# Patient Record
Sex: Female | Born: 1985 | Race: Black or African American | Hispanic: No | Marital: Married | Smoking: Former smoker
Health system: Southern US, Community
[De-identification: ages and names within clinical notes are randomized; demographics above are authoritative.]

## PROBLEM LIST (undated history)

## (undated) DIAGNOSIS — Z789 Other specified health status: Secondary | ICD-10-CM

## (undated) DIAGNOSIS — F331 Major depressive disorder, recurrent, moderate: Secondary | ICD-10-CM

## (undated) DIAGNOSIS — F32A Depression, unspecified: Secondary | ICD-10-CM

## (undated) DIAGNOSIS — J302 Other seasonal allergic rhinitis: Secondary | ICD-10-CM

## (undated) DIAGNOSIS — O24419 Gestational diabetes mellitus in pregnancy, unspecified control: Secondary | ICD-10-CM

## (undated) DIAGNOSIS — F329 Major depressive disorder, single episode, unspecified: Secondary | ICD-10-CM

## (undated) DIAGNOSIS — Z720 Tobacco use: Secondary | ICD-10-CM

## (undated) DIAGNOSIS — R87629 Unspecified abnormal cytological findings in specimens from vagina: Secondary | ICD-10-CM

## (undated) DIAGNOSIS — I10 Essential (primary) hypertension: Secondary | ICD-10-CM

## (undated) DIAGNOSIS — T7840XA Allergy, unspecified, initial encounter: Secondary | ICD-10-CM

## (undated) HISTORY — DX: Unspecified abnormal cytological findings in specimens from vagina: R87.629

## (undated) HISTORY — DX: Essential (primary) hypertension: I10

## (undated) HISTORY — DX: Major depressive disorder, recurrent, moderate: F33.1

## (undated) HISTORY — DX: Tobacco use: Z72.0

## (undated) HISTORY — DX: Gestational diabetes mellitus in pregnancy, unspecified control: O24.419

## (undated) HISTORY — DX: Major depressive disorder, single episode, unspecified: F32.9

## (undated) HISTORY — DX: Allergy, unspecified, initial encounter: T78.40XA

## (undated) HISTORY — DX: Other seasonal allergic rhinitis: J30.2

## (undated) HISTORY — DX: Depression, unspecified: F32.A

## (undated) HISTORY — PX: OTHER SURGICAL HISTORY: SHX169

---
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
## 2006-02-03 ENCOUNTER — Ambulatory Visit: Payer: Self-pay | Admitting: Family Medicine

## 2006-02-21 ENCOUNTER — Ambulatory Visit: Payer: Self-pay | Admitting: Family Medicine

## 2006-02-21 ENCOUNTER — Encounter (INDEPENDENT_AMBULATORY_CARE_PROVIDER_SITE_OTHER): Payer: Self-pay | Admitting: *Deleted

## 2006-05-04 ENCOUNTER — Ambulatory Visit: Payer: Self-pay | Admitting: Family Medicine

## 2006-05-10 ENCOUNTER — Encounter (INDEPENDENT_AMBULATORY_CARE_PROVIDER_SITE_OTHER): Payer: Self-pay | Admitting: Specialist

## 2006-05-10 ENCOUNTER — Other Ambulatory Visit: Admission: RE | Admit: 2006-05-10 | Discharge: 2006-05-10 | Payer: Self-pay | Admitting: Obstetrics & Gynecology

## 2006-05-10 ENCOUNTER — Ambulatory Visit: Payer: Self-pay | Admitting: Obstetrics & Gynecology

## 2006-05-24 ENCOUNTER — Ambulatory Visit: Payer: Self-pay | Admitting: Obstetrics & Gynecology

## 2006-06-01 DIAGNOSIS — G43909 Migraine, unspecified, not intractable, without status migrainosus: Secondary | ICD-10-CM

## 2006-06-01 HISTORY — DX: Migraine, unspecified, not intractable, without status migrainosus: G43.909

## 2006-06-15 ENCOUNTER — Ambulatory Visit: Payer: Self-pay | Admitting: Family Medicine

## 2006-07-14 ENCOUNTER — Ambulatory Visit: Payer: Self-pay | Admitting: Family Medicine

## 2006-07-14 DIAGNOSIS — N912 Amenorrhea, unspecified: Secondary | ICD-10-CM | POA: Insufficient documentation

## 2006-07-21 ENCOUNTER — Ambulatory Visit: Payer: Self-pay | Admitting: Family Medicine

## 2006-07-21 LAB — CONVERTED CEMR LAB: Beta hcg, urine, semiquantitative: NEGATIVE

## 2006-08-04 ENCOUNTER — Telehealth: Payer: Self-pay | Admitting: *Deleted

## 2006-08-07 ENCOUNTER — Ambulatory Visit: Payer: Self-pay | Admitting: Sports Medicine

## 2006-08-07 DIAGNOSIS — J301 Allergic rhinitis due to pollen: Secondary | ICD-10-CM

## 2006-08-08 ENCOUNTER — Telehealth (INDEPENDENT_AMBULATORY_CARE_PROVIDER_SITE_OTHER): Payer: Self-pay | Admitting: Family Medicine

## 2006-08-10 ENCOUNTER — Telehealth: Payer: Self-pay | Admitting: *Deleted

## 2006-09-14 ENCOUNTER — Telehealth: Payer: Self-pay | Admitting: *Deleted

## 2006-09-19 ENCOUNTER — Encounter (INDEPENDENT_AMBULATORY_CARE_PROVIDER_SITE_OTHER): Payer: Self-pay | Admitting: Family Medicine

## 2006-09-19 ENCOUNTER — Ambulatory Visit: Payer: Self-pay | Admitting: Sports Medicine

## 2006-09-19 LAB — CONVERTED CEMR LAB
Beta hcg, urine, semiquantitative: NEGATIVE
GC Probe Amp, Genital: NEGATIVE

## 2006-09-20 ENCOUNTER — Encounter (INDEPENDENT_AMBULATORY_CARE_PROVIDER_SITE_OTHER): Payer: Self-pay | Admitting: Family Medicine

## 2006-10-09 ENCOUNTER — Ambulatory Visit: Payer: Self-pay | Admitting: Sports Medicine

## 2006-11-16 ENCOUNTER — Ambulatory Visit: Payer: Self-pay | Admitting: Family Medicine

## 2006-11-16 ENCOUNTER — Encounter: Payer: Self-pay | Admitting: Family Medicine

## 2006-12-25 ENCOUNTER — Ambulatory Visit: Payer: Self-pay | Admitting: Family Medicine

## 2007-02-21 ENCOUNTER — Telehealth: Payer: Self-pay | Admitting: *Deleted

## 2007-02-22 ENCOUNTER — Ambulatory Visit: Payer: Self-pay | Admitting: Family Medicine

## 2007-02-22 ENCOUNTER — Encounter (INDEPENDENT_AMBULATORY_CARE_PROVIDER_SITE_OTHER): Payer: Self-pay | Admitting: Family Medicine

## 2007-03-22 ENCOUNTER — Ambulatory Visit: Payer: Self-pay | Admitting: Family Medicine

## 2007-04-05 ENCOUNTER — Emergency Department (HOSPITAL_COMMUNITY): Admission: EM | Admit: 2007-04-05 | Discharge: 2007-04-05 | Payer: Self-pay | Admitting: Emergency Medicine

## 2007-04-05 HISTORY — PX: UMBILICAL HERNIA REPAIR: SHX196

## 2007-05-30 ENCOUNTER — Ambulatory Visit: Payer: Self-pay | Admitting: Gynecology

## 2007-05-30 ENCOUNTER — Encounter: Payer: Self-pay | Admitting: Family

## 2007-08-28 ENCOUNTER — Emergency Department (HOSPITAL_COMMUNITY): Admission: EM | Admit: 2007-08-28 | Discharge: 2007-08-28 | Payer: Self-pay | Admitting: Family Medicine

## 2007-09-01 ENCOUNTER — Inpatient Hospital Stay (HOSPITAL_COMMUNITY): Admission: AD | Admit: 2007-09-01 | Discharge: 2007-09-01 | Payer: Self-pay | Admitting: Obstetrics & Gynecology

## 2007-09-03 ENCOUNTER — Telehealth: Payer: Self-pay | Admitting: *Deleted

## 2007-09-05 ENCOUNTER — Inpatient Hospital Stay (HOSPITAL_COMMUNITY): Admission: AD | Admit: 2007-09-05 | Discharge: 2007-09-05 | Payer: Self-pay | Admitting: Gynecology

## 2007-09-12 ENCOUNTER — Encounter: Payer: Self-pay | Admitting: Family Medicine

## 2007-09-12 ENCOUNTER — Ambulatory Visit: Payer: Self-pay | Admitting: Family Medicine

## 2007-09-12 LAB — CONVERTED CEMR LAB
Antibody Screen: NEGATIVE
Basophils Relative: 0 % (ref 0–1)
Eosinophils Absolute: 0.3 10*3/uL (ref 0.0–0.7)
Eosinophils Relative: 3 % (ref 0–5)
HCT: 39.4 % (ref 36.0–46.0)
Lymphs Abs: 3.5 10*3/uL (ref 0.7–4.0)
MCHC: 34.8 g/dL (ref 30.0–36.0)
MCV: 90.8 fL (ref 78.0–100.0)
Monocytes Absolute: 0.7 10*3/uL (ref 0.1–1.0)
Monocytes Relative: 8 % (ref 3–12)
RBC: 4.34 M/uL (ref 3.87–5.11)
Rh Type: POSITIVE
Sickle Cell Screen: NEGATIVE
WBC: 9.1 10*3/uL (ref 4.0–10.5)

## 2007-09-17 ENCOUNTER — Telehealth: Payer: Self-pay | Admitting: *Deleted

## 2007-09-26 ENCOUNTER — Ambulatory Visit: Payer: Self-pay | Admitting: Family Medicine

## 2007-09-26 ENCOUNTER — Encounter (INDEPENDENT_AMBULATORY_CARE_PROVIDER_SITE_OTHER): Payer: Self-pay | Admitting: Family Medicine

## 2007-09-26 ENCOUNTER — Encounter: Payer: Self-pay | Admitting: Family Medicine

## 2007-09-26 ENCOUNTER — Other Ambulatory Visit: Admission: RE | Admit: 2007-09-26 | Discharge: 2007-09-26 | Payer: Self-pay | Admitting: Family Medicine

## 2007-09-26 LAB — CONVERTED CEMR LAB
GC Culture Only: NEGATIVE
GC Probe Amp, Genital: NEGATIVE
Glucose, Urine, Semiquant: NEGATIVE

## 2007-10-02 ENCOUNTER — Encounter (INDEPENDENT_AMBULATORY_CARE_PROVIDER_SITE_OTHER): Payer: Self-pay | Admitting: *Deleted

## 2007-10-02 ENCOUNTER — Ambulatory Visit (HOSPITAL_COMMUNITY): Admission: RE | Admit: 2007-10-02 | Discharge: 2007-10-02 | Payer: Self-pay | Admitting: Family Medicine

## 2007-10-15 ENCOUNTER — Encounter: Payer: Self-pay | Admitting: Family Medicine

## 2007-10-15 ENCOUNTER — Ambulatory Visit (HOSPITAL_COMMUNITY): Admission: RE | Admit: 2007-10-15 | Discharge: 2007-10-15 | Payer: Self-pay | Admitting: Family Medicine

## 2007-10-16 ENCOUNTER — Encounter: Payer: Self-pay | Admitting: Family Medicine

## 2007-11-05 ENCOUNTER — Ambulatory Visit (HOSPITAL_COMMUNITY): Admission: RE | Admit: 2007-11-05 | Discharge: 2007-11-05 | Payer: Self-pay | Admitting: Family Medicine

## 2007-11-08 ENCOUNTER — Ambulatory Visit: Payer: Self-pay | Admitting: Family Medicine

## 2007-11-16 ENCOUNTER — Encounter: Payer: Self-pay | Admitting: Family Medicine

## 2007-11-19 ENCOUNTER — Ambulatory Visit (HOSPITAL_COMMUNITY): Admission: RE | Admit: 2007-11-19 | Discharge: 2007-11-19 | Payer: Self-pay | Admitting: Family Medicine

## 2007-11-22 ENCOUNTER — Telehealth: Payer: Self-pay | Admitting: *Deleted

## 2007-12-04 ENCOUNTER — Ambulatory Visit: Payer: Self-pay | Admitting: Sports Medicine

## 2007-12-04 ENCOUNTER — Telehealth: Payer: Self-pay | Admitting: *Deleted

## 2007-12-04 DIAGNOSIS — O4690 Antepartum hemorrhage, unspecified, unspecified trimester: Secondary | ICD-10-CM | POA: Insufficient documentation

## 2007-12-05 ENCOUNTER — Ambulatory Visit: Payer: Self-pay | Admitting: Family Medicine

## 2007-12-05 LAB — CONVERTED CEMR LAB
Glucose, Urine, Semiquant: NEGATIVE
Protein, U semiquant: NEGATIVE

## 2008-01-04 ENCOUNTER — Ambulatory Visit: Payer: Self-pay | Admitting: Family Medicine

## 2008-01-04 LAB — CONVERTED CEMR LAB: Protein, U semiquant: NEGATIVE

## 2008-01-28 ENCOUNTER — Ambulatory Visit: Payer: Self-pay | Admitting: Family Medicine

## 2008-01-28 ENCOUNTER — Encounter: Payer: Self-pay | Admitting: Family Medicine

## 2008-01-28 LAB — CONVERTED CEMR LAB: GTT, 1 hr: <140 mg/dL

## 2008-02-02 LAB — CONVERTED CEMR LAB
HCT: 34.2 % — ABNORMAL LOW (ref 36.0–46.0)
Platelets: 242 10*3/uL (ref 150–400)
RDW: 14.4 % (ref 11.5–15.5)

## 2008-02-12 ENCOUNTER — Encounter: Payer: Self-pay | Admitting: Family Medicine

## 2008-02-12 ENCOUNTER — Ambulatory Visit: Payer: Self-pay | Admitting: Family Medicine

## 2008-02-25 ENCOUNTER — Ambulatory Visit: Payer: Self-pay | Admitting: Family Medicine

## 2008-03-11 ENCOUNTER — Ambulatory Visit: Payer: Self-pay | Admitting: Family Medicine

## 2008-03-21 ENCOUNTER — Ambulatory Visit: Payer: Self-pay | Admitting: Family Medicine

## 2008-03-21 ENCOUNTER — Encounter: Payer: Self-pay | Admitting: Family Medicine

## 2008-03-25 ENCOUNTER — Encounter: Payer: Self-pay | Admitting: Family Medicine

## 2008-03-25 ENCOUNTER — Ambulatory Visit: Payer: Self-pay | Admitting: Family Medicine

## 2008-03-25 LAB — CONVERTED CEMR LAB: Chlamydia, DNA Probe: NEGATIVE

## 2008-04-02 ENCOUNTER — Encounter: Payer: Self-pay | Admitting: Family Medicine

## 2008-04-02 ENCOUNTER — Ambulatory Visit: Payer: Self-pay | Admitting: Family Medicine

## 2008-04-11 ENCOUNTER — Ambulatory Visit: Payer: Self-pay | Admitting: Family Medicine

## 2008-04-15 ENCOUNTER — Ambulatory Visit: Payer: Self-pay | Admitting: Family Medicine

## 2008-04-20 ENCOUNTER — Inpatient Hospital Stay (HOSPITAL_COMMUNITY): Admission: AD | Admit: 2008-04-20 | Discharge: 2008-04-20 | Payer: Self-pay | Admitting: Obstetrics & Gynecology

## 2008-04-22 ENCOUNTER — Encounter: Payer: Self-pay | Admitting: Family Medicine

## 2008-04-22 ENCOUNTER — Ambulatory Visit: Payer: Self-pay | Admitting: Obstetrics and Gynecology

## 2008-04-22 ENCOUNTER — Inpatient Hospital Stay (HOSPITAL_COMMUNITY): Admission: AD | Admit: 2008-04-22 | Discharge: 2008-04-24 | Payer: Self-pay | Admitting: Family Medicine

## 2008-06-04 ENCOUNTER — Encounter: Payer: Self-pay | Admitting: Family Medicine

## 2008-06-04 ENCOUNTER — Ambulatory Visit: Payer: Self-pay | Admitting: Family Medicine

## 2008-06-25 ENCOUNTER — Ambulatory Visit: Payer: Self-pay | Admitting: Family Medicine

## 2008-06-27 ENCOUNTER — Ambulatory Visit: Payer: Self-pay | Admitting: Family Medicine

## 2008-07-24 ENCOUNTER — Emergency Department (HOSPITAL_COMMUNITY): Admission: EM | Admit: 2008-07-24 | Discharge: 2008-07-24 | Payer: Self-pay | Admitting: Emergency Medicine

## 2008-09-01 IMAGING — US US OB DETAIL+14 WK
1 series · 14 of 28 positions shown · non-contrast
Comparison: none

OBSTETRICAL ULTRASOUND:
 This ultrasound was performed in The [HOSPITAL], and the [facility] report will be stored to [REDACTED] PACS.

[Series 1: us ob detail+14 wk · 14 of 85 slices shown]
[im 4/85]
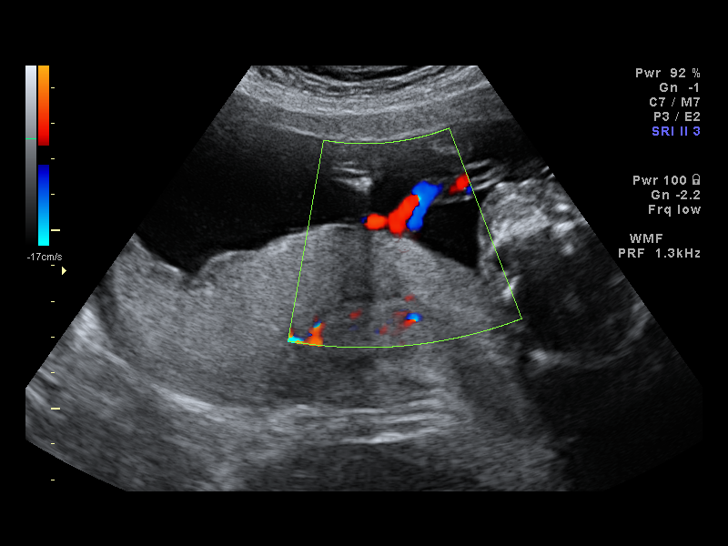
[im 10/85]
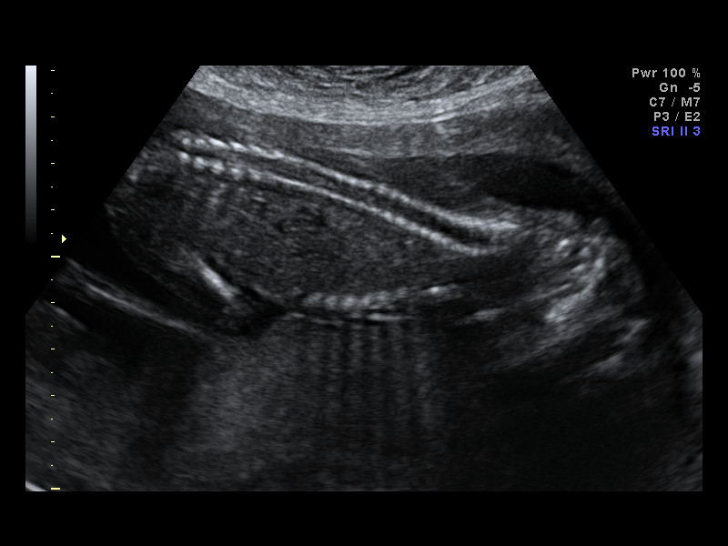
[im 16/85]
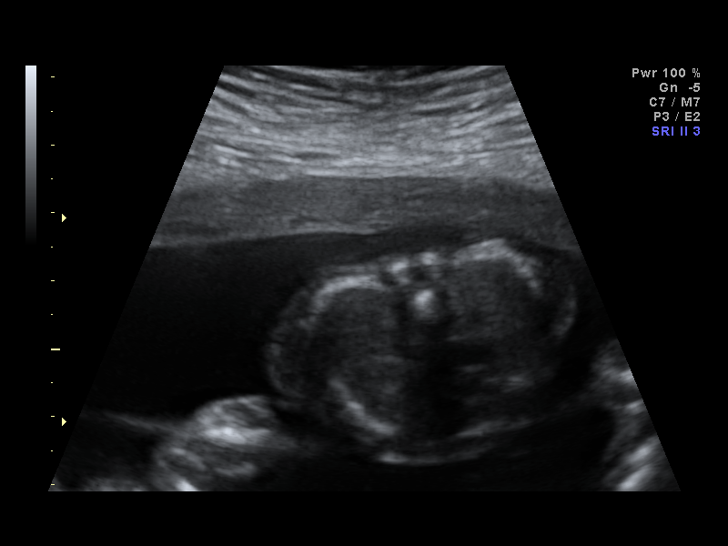
[im 22/85]
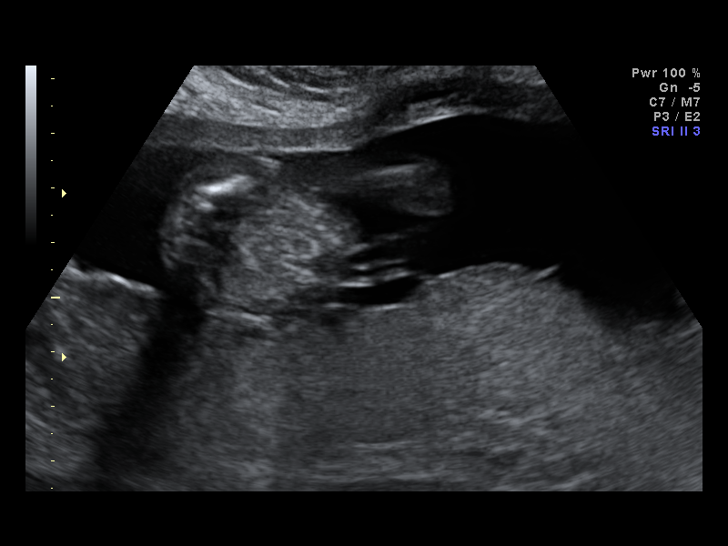
[im 29/85]
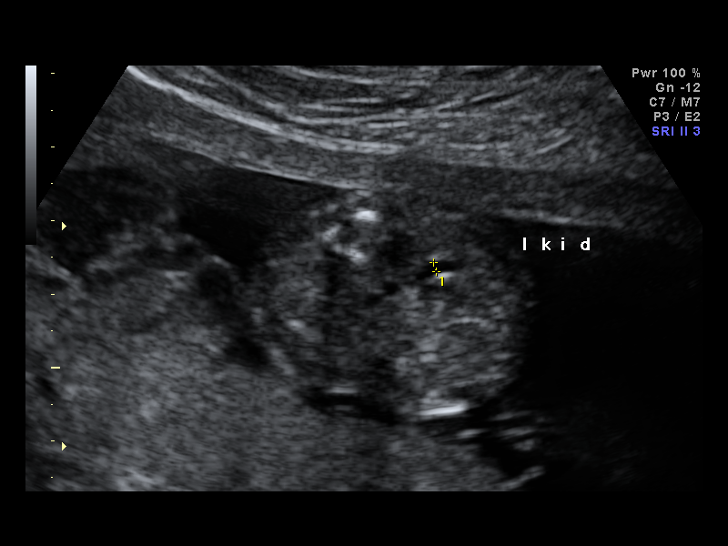
[im 35/85]
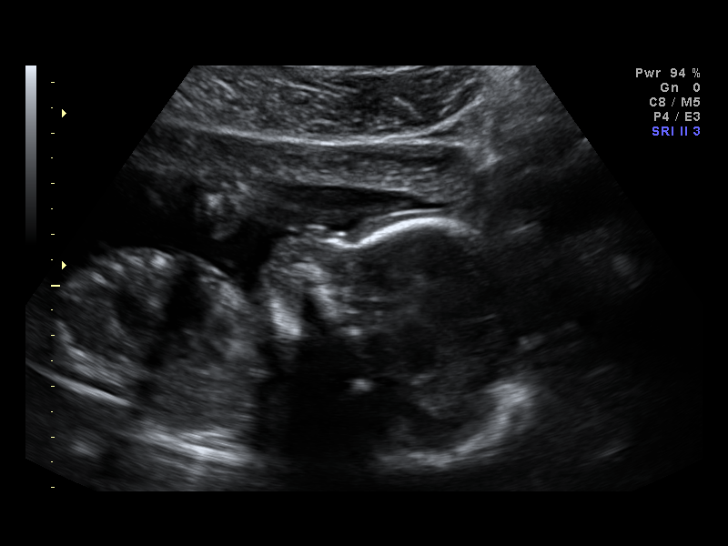
[im 41/85]
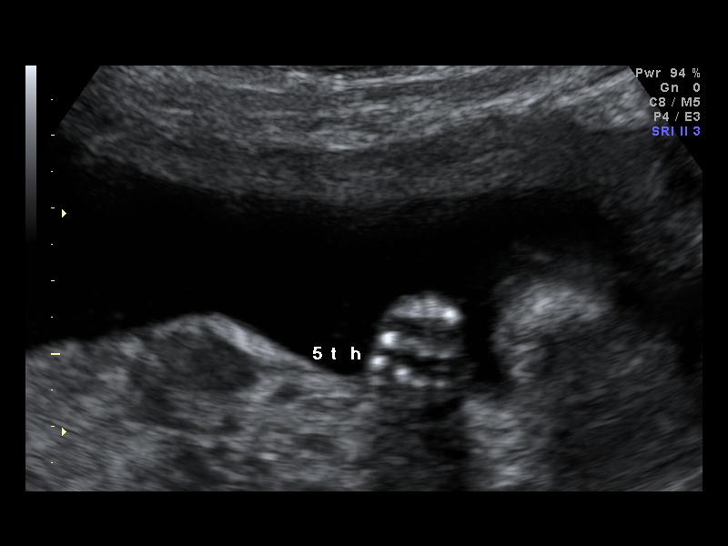
[im 47/85]
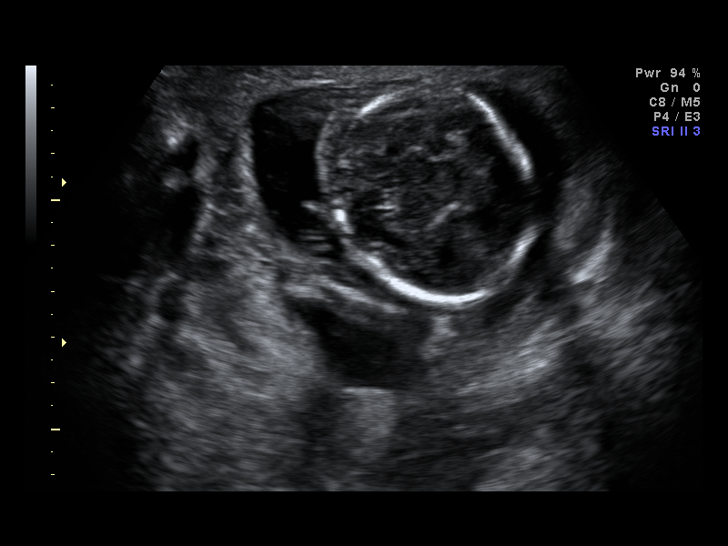
[im 53/85]
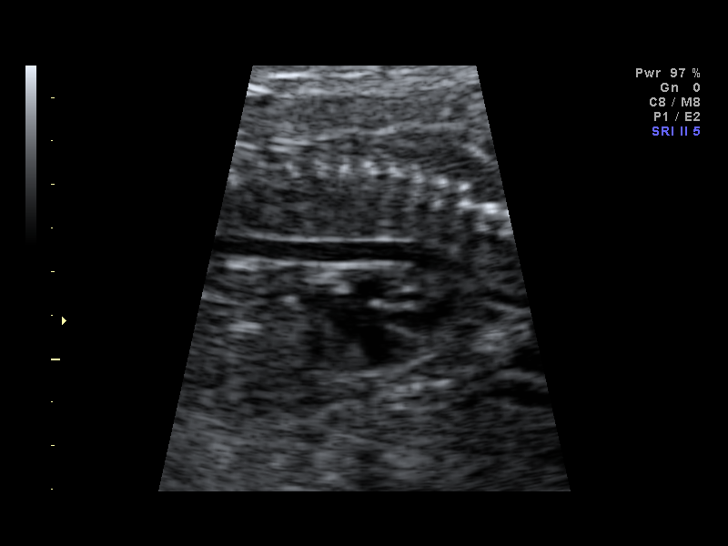
[im 60/85]
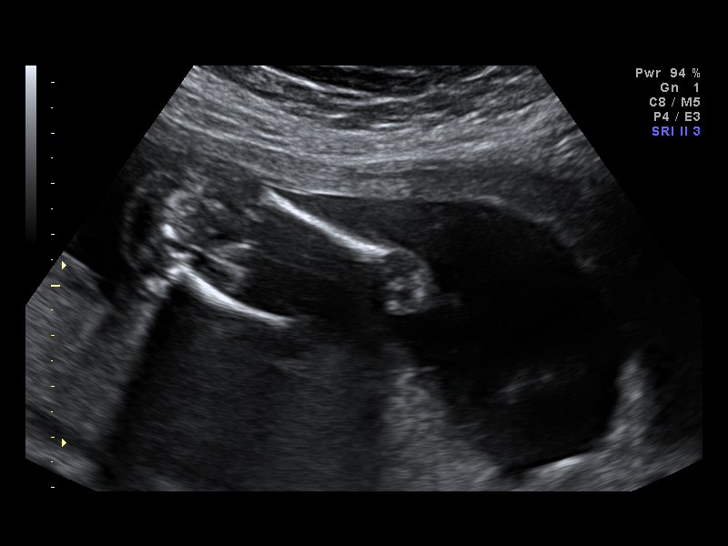
[im 66/85]
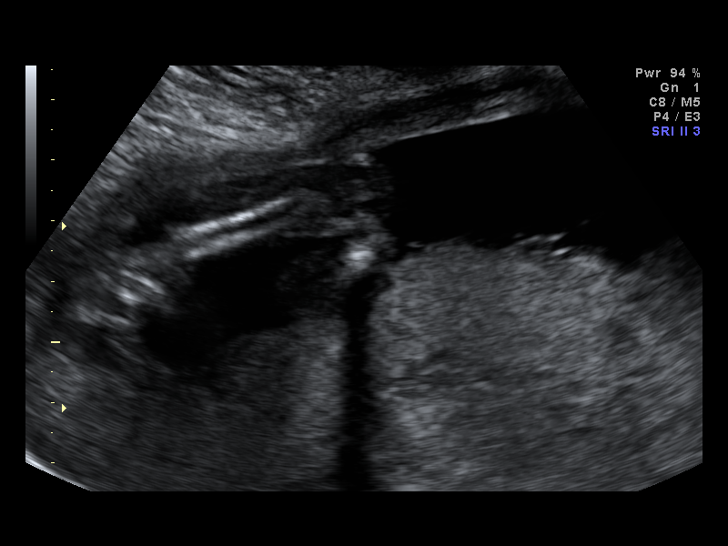
[im 72/85]
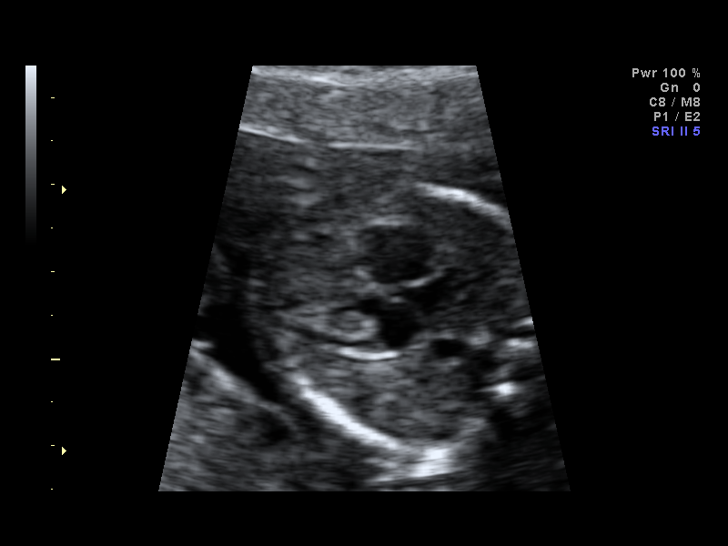
[im 78/85]
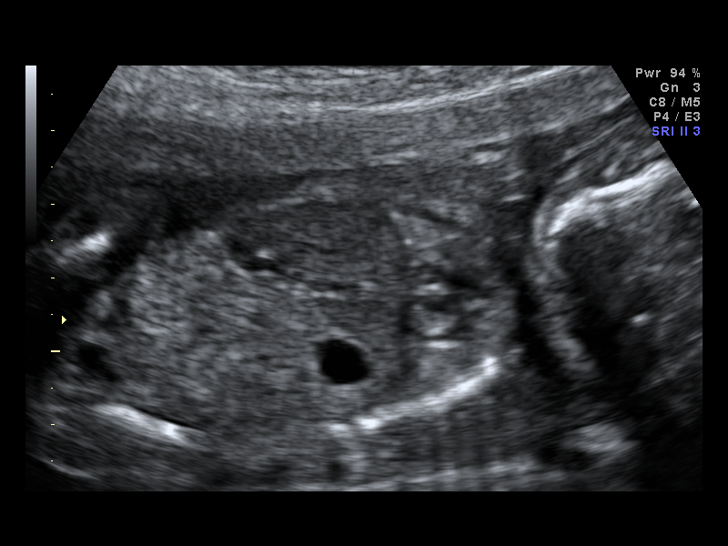
[im 85/85]
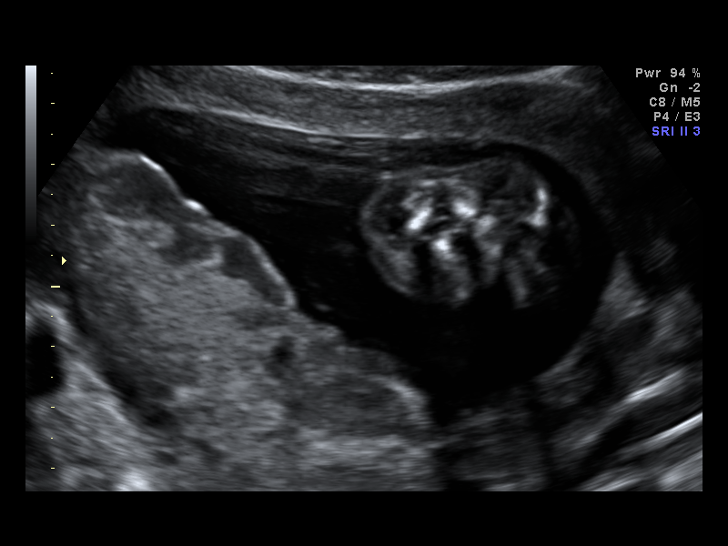

[14 of 28 positions shown; findings below may reference images not displayed]

IMPRESSION: [facility] has also been faxed to the ordering physician.

## 2008-10-27 ENCOUNTER — Encounter: Payer: Self-pay | Admitting: Family Medicine

## 2009-01-30 ENCOUNTER — Ambulatory Visit: Payer: Self-pay | Admitting: Family Medicine

## 2009-10-07 ENCOUNTER — Ambulatory Visit: Payer: Self-pay | Admitting: Family Medicine

## 2009-10-07 DIAGNOSIS — R1033 Periumbilical pain: Secondary | ICD-10-CM | POA: Insufficient documentation

## 2009-10-22 ENCOUNTER — Encounter: Payer: Self-pay | Admitting: Family Medicine

## 2009-12-02 ENCOUNTER — Ambulatory Visit (HOSPITAL_BASED_OUTPATIENT_CLINIC_OR_DEPARTMENT_OTHER): Admission: RE | Admit: 2009-12-02 | Discharge: 2009-12-02 | Payer: Self-pay | Admitting: General Surgery

## 2009-12-16 ENCOUNTER — Encounter: Payer: Self-pay | Admitting: Family Medicine

## 2010-03-25 ENCOUNTER — Ambulatory Visit: Payer: Self-pay | Admitting: Family Medicine

## 2010-03-25 DIAGNOSIS — L732 Hidradenitis suppurativa: Secondary | ICD-10-CM

## 2010-03-25 HISTORY — DX: Hidradenitis suppurativa: L73.2

## 2010-05-04 NOTE — Consult Note (Signed)
Summary: [facility]   Imported By: Clydell Hakim 10/29/2009 16:21:34  _____________________________________________________________________  External Attachment:    Type:   Image     Comment:   External Document

## 2010-05-04 NOTE — Consult Note (Signed)
Summary: [facility]   Imported By: Clydell Hakim 12/25/2009 11:28:55  _____________________________________________________________________  External Attachment:    Type:   Image     Comment:   External Document

## 2010-05-04 NOTE — Assessment & Plan Note (Signed)
Summary: stomach problem,tcb   Vital Signs:  Patient profile:   25 year old female Height:      64.5 inches Weight:      158 pounds BMI:     26.80 BSA:     1.78 Temp:     98.6 degrees F Pulse rate:   81 / minute BP sitting:   125 / 78  Vitals Entered By: Jone Baseman CMA (October 07, 2009 3:16 PM) CC: stoamch pain Is Patient Diabetic? No Pain Assessment Patient in pain? no        Primary Provider:  Ardyth Gal MD  CC:  stoamch pain.  History of Present Illness: Brandi Sanders says that about one week ago she started having pain above her belly button.  She says it feels like a knot or a ball.  She says the pain is severe, that it does not radiate anywhere.  She says it is made worse by having a bowel movement and made better by lying down and rubbing her stomach.  She feels like something pops in and out.  She has never had this problem before.    She denies any injury or incident that may have caused this.  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    Allergies: No Known Drug Allergies  Past History:  Past Medical History: Last updated: 02/22/2007 none  Past Surgical History: Last updated: 02/22/2007 none  Family History: Last updated: 06/01/2006 mother - htn, sisters - healthy  Social History: Last updated: 09/26/2007 Lives with Vivia Rosenburg (daughter - 72years old) and her father Nena Polio.  Family in the area (mom- Jersie Beel), and twin sisters (1 yr younger than pt).  2 cigs/day for several years. No etoh,drugs. Not currently working. Got to 11th grade in school.  Family History: Reviewed history from 06/01/2006 and no changes required. mother - htn, sisters - healthy  Review of Systems GI:  Complains of abdominal pain; denies bloody stools, change in bowel habits, dark tarry stools, diarrhea, indigestion, nausea, and vomiting.  Physical Exam  General:  Well-developed,well-nourished,in no acute distress; alert,appropriate and  cooperative throughout examination Head:  normocephalic and atraumatic.   Eyes:  vision grossly intact.   Lungs:  Normal respiratory effort, chest expands symmetrically. Lungs are clear to auscultation, no crackles or wheezes. Heart:  Normal rate and regular rhythm. S1 and S2 normal without gallop, murmur, click, rub or other extra sounds. Abdomen:  soft, normal bowel sounds, no distention, minimal umbilical hernia, and epigastric tenderness.   Psych:  Cognition and judgment appear intact. Alert and cooperative with normal attention span and concentration. No apparent delusions, illusions, hallucinations   Impression & Recommendations:  Problem # 1:  ? of UMBILICAL HERNIA (ICD-553.1) Assessment New  Will refer to general surgery for evaluation of possible hernia.   Pt. advised not to hold her breath when lifting things or her children.  We also discussed proper lifting techniques such as using her legs, not her back.   Advised patient to contact a physician if her pain became unbearable or she felt she was unable to reduce the mass.    Orders: Surgical Referral (Surgery) [facility]- Est Level  3 (36144)  Problem # 2:  ABDOMINAL PAIN, PERIUMBILICAL (ICD-789.05) Assessment: New  Orders: [facility]- Est Level  3 (31540)  Complete Medication List: 1)  Claritin-d 12 Hour 5-120 Mg Tb12 (Loratadine-pseudoephedrine) .Marland Kitchen.. 1 tablet twice a day for nasal drainage and congestion. 2)  Prenavite Multiple Vitamin 28-0.8 Mg Tabs (Prenatal  vit-fe fumarate-fa) .Marland Kitchen.. 1 tab by mouth daily 3)  Mirena 20 Mcg/24hr Iud (Levonorgestrel)  Patient Instructions: 1)  Do not hold your breath when lifting heavy objects or having a bowel movement.  Please call/see a physician if your pain becomes unbearable.   2)  An appointment with a general surgeon will be made for you and we will call you with the date and time.

## 2010-05-06 NOTE — Assessment & Plan Note (Signed)
Summary: painful knot under arm,df   Vital Signs:  Patient profile:   25 year old female Weight:      166 pounds Temp:     98.7 degrees F oral Pulse rate:   111 / minute Pulse rhythm:   regular BP sitting:   147 / 83  (left arm) Cuff size:   regular  Vitals Entered By: Loralee Pacas CMA (March 25, 2010 10:24 AM) CC: knot under left arm x 1 week Comments pt states that the knots are very sore and can hardly lift her arm up. has never had this before   Primary Care Tykee Heideman:  Ardyth Gal MD  CC:  knot under left arm x 1 week.  History of Present Illness: 1) Painful knot under left arm: x 1 week. Swollen and painful. Denies redness or drainage. Never had this before. Does not shave underarms but uses Darene Lamer.   Denies fever, chills, breast pain or nipple drainage or skin change, sore throat, URI symptoms, animal scratch or bite.   Med rec as per prior meds   Medications Prior to Update: 1)  Claritin-D 12 Hour 5-120 Mg  Tb12 (Loratadine-Pseudoephedrine) .Marland Kitchen.. 1 Tablet Twice A Day For Nasal Drainage and Congestion. 2)  Prenavite Multiple Vitamin 28-0.8 Mg  Tabs (Prenatal Vit-Fe Fumarate-Fa) .Marland Kitchen.. 1 Tab By Mouth Daily 3)  Mirena 20 Mcg/24hr Iud (Levonorgestrel)  Allergies (verified): No Known Drug Allergies  Physical Exam  General:  slightly overweight, NAD, vitals reviewed  Neck:  no lymphadenopathy   Skin:  - hidradenitis (two 2 x 3 cm areas of induration w/o erythema or fluctuance) left axilla w/o drainage or ulceration  - right axilla normal    Impression & Recommendations:  Problem # 1:  HIDRADENITIS ([id number]) Assessment New  New problem. Will treat with oral antibiotics course. Advised to avoid using any products underarm besides antipersperant. Advised to avoid shaving underarm. Advised to stop smoking. Warm compresses for comfort. Follow up with PCP as needed - consider further therapies based on freqency of flares, resistance to treatment etc. No need for  surgical referral at this time.   Orders: [facility]- Est Level  3 (99213)  Complete Medication List: 1)  Claritin-d 12 Hour 5-120 Mg Tb12 (Loratadine-pseudoephedrine) .Marland Kitchen.. 1 tablet twice a day for nasal drainage and congestion. 2)  Prenavite Multiple Vitamin 28-0.8 Mg Tabs (Prenatal vit-fe fumarate-fa) .Marland Kitchen.. 1 tab by mouth daily 3)  Mirena 20 Mcg/24hr Iud (Levonorgestrel) 4)  Doxycycline Hyclate 100 Mg Tabs (Doxycycline hyclate) .... One tab by mouth two times a day x 10 days 5)  Ibuprofen 800 Mg Tabs (Ibuprofen) .... One tab by mouth up to three times a day as needed pain  Patient Instructions: 1)  Do not shave the underarm.  2)  Reduce further trauma to the involved areas by avoiding heat and humidity, friction from clothing, and perspiration. 3)  Weight loss and wearing loose breathable clothing and avoiding tight, synthetic clothing over affected areas can be helpful. 4)  Quitting smoking may be helpful in preventing this from flaring up.  5)  Follow up in two to three weeks with Dr. Lula Olszewski if not better. Prescriptions: IBUPROFEN 800 MG TABS (IBUPROFEN) one tab by mouth up to three times a day as needed pain  #30 x 1   Entered and Authorized by:   Bobby Rumpf  MD   Signed by:   Bobby Rumpf  MD on 03/25/2010   Method used:   Electronically to  [facility]* (retail)       [address]       [place], [place]  [address]       Ph: [phone]       Fax: [phone]   RxID:   [id number] DOXYCYCLINE HYCLATE 100 MG TABS (DOXYCYCLINE HYCLATE) one tab by mouth two times a day x 10 days  #20 x 0   Entered and Authorized by:   Bobby Rumpf  MD   Signed by:   Bobby Rumpf  MD on 03/25/2010   Method used:   Electronically to        [facility]* (retail)       [address]       [place], [place]  [address]       Ph: [phone]       Fax: [phone]   RxID:   [id number]    Orders Added: 1)  [facility]- Est Level  3 [[address]301]

## 2010-06-18 LAB — CBC
HCT: 38.1 % (ref 36.0–46.0)
Hemoglobin: 13.4 g/dL (ref 12.0–15.0)
MCHC: 35.2 g/dL (ref 30.0–36.0)
MCV: 91.8 fL (ref 78.0–100.0)
WBC: 7.3 10*3/uL (ref 4.0–10.5)

## 2010-06-18 LAB — DIFFERENTIAL
Lymphocytes Relative: 42 % (ref 12–46)
Monocytes Absolute: 0.5 10*3/uL (ref 0.1–1.0)
Monocytes Relative: 7 % (ref 3–12)
Neutro Abs: 3.4 10*3/uL (ref 1.7–7.7)

## 2010-06-18 LAB — PREGNANCY, URINE: Preg Test, Ur: NEGATIVE

## 2010-07-19 LAB — RPR: RPR Ser Ql: NONREACTIVE

## 2010-07-19 LAB — CBC
HCT: 35.5 % — ABNORMAL LOW (ref 36.0–46.0)
Platelets: 180 10*3/uL (ref 150–400)
RDW: 14.5 % (ref 11.5–15.5)
WBC: 10.3 10*3/uL (ref 4.0–10.5)

## 2010-07-21 ENCOUNTER — Ambulatory Visit (INDEPENDENT_AMBULATORY_CARE_PROVIDER_SITE_OTHER): Payer: Medicaid Other | Admitting: Family Medicine

## 2010-07-21 ENCOUNTER — Encounter: Payer: Self-pay | Admitting: Family Medicine

## 2010-07-21 VITALS — BP 129/80 | HR 111 | Temp 99.0°F | Wt 173.2 lb

## 2010-07-21 DIAGNOSIS — J301 Allergic rhinitis due to pollen: Secondary | ICD-10-CM

## 2010-07-21 MED ORDER — MOMETASONE FUROATE 50 MCG/ACT NA SUSP: 2.0000 | Freq: Every day | NASAL | Status: DC

## 2010-07-21 MED ORDER — FEXOFENADINE HCL 180 MG PO TABS: 180.0000 mg | ORAL_TABLET | Freq: Every day | ORAL | Status: DC

## 2010-07-21 NOTE — Progress Notes (Signed)
  Subjective:    Patient ID: Brandi Sanders, female    DOB: May 20, 1985, 25 y.o.   MRN: [id number]  Sinusitis The current episode started 1 to 4 weeks ago. The problem has been gradually worsening since onset. There has been no fever. Associated symptoms include congestion, coughing and sneezing. Treatments tried: anti-histamines. The treatment provided no relief.      Review of Systems  HENT: Positive for congestion, rhinorrhea and sneezing.   Eyes: Positive for pain, redness and itching.  Respiratory: Positive for cough and chest tightness.        Objective:   Physical Exam  Constitutional: She appears well-developed and well-nourished.  HENT:  Head: Normocephalic and atraumatic. Head is with right periorbital erythema and with left periorbital erythema.  Right Ear: Tympanic membrane normal.  Left Ear: Tympanic membrane normal.  Nose: Mucosal edema and rhinorrhea present.  Mouth/Throat: Oropharynx is clear and moist.  Cardiovascular: Normal rate and regular rhythm.   Pulmonary/Chest: Effort normal and breath sounds normal.          Assessment & Plan:

## 2010-07-21 NOTE — Assessment & Plan Note (Signed)
Needs better medicine

## 2010-07-21 NOTE — Patient Instructions (Addendum)
Allergic Rhinitis Allergic rhinitis is when the mucous membranes in the nose respond to allergens. Allergens are particles in the air that cause your body to have an allergic reaction. This causes you to release allergic antibodies. Through a chain of events, these eventually cause you to release histamine into the blood stream (hence the use of antihistamines). Although meant to be protective to the body, it is this release that causes your discomfort, such as frequent sneezing, congestion and an itchy runny nose.  CAUSES The pollen allergens may come from grasses, trees, and weeds. This is seasonal allergic rhinitis, or "hay fever." Other allergens cause year-round allergic rhinitis (perennial allergic rhinitis) such as house dust mite allergen, pet dander and mold spores.  SYMPTOMS  Nasal stuffiness (congestion).   Runny, itchy nose with sneezing and tearing of the eyes.   There is often an itching of the mouth, eyes and ears.  It cannot be cured, but it can be controlled with medications. DIAGNOSIS If you are unable to determine the offending allergen, skin or blood testing may find it. TREATMENT  Avoid the allergen.   Medications and allergy shots (immunotherapy) can help.   Hay fever may often be treated with antihistamines in pill or nasal spray forms. Antihistamines block the effects of histamine. There are over-the-counter medicines that may help with nasal congestion and swelling around the eyes. Check with your caregiver before taking or giving this medicine.  If the treatment above does not work, there are many new medications your caregiver can prescribe. Stronger medications may be used if initial measures are ineffective. Desensitizing injections can be used if medications and avoidance fails. Desensitization is when a patient is given ongoing shots until the body becomes less sensitive to the allergen. Make sure you follow up with your caregiver if problems continue. SEEK  MEDICAL CARE IF:   You develop fever (more than 100.71F (38.1 C).   You develop a cough that does not stop easily (persistent).   You have shortness of breath.   You start wheezing.   Symptoms interfere with normal daily activities.  Document Released: 12/14/2000 Document Re-Released: 04/12/2009 [facility] Patient Information 2011 [place], [place].Smoking Cessation This document explains the best ways for you to quit smoking and new treatments to help. It lists new medicines that can double or triple your chances of quitting and quitting for good. It also considers ways to avoid relapses and concerns you may have about quitting, including weight gain. NICOTINE: A POWERFUL ADDICTION If you have tried to quit smoking, you know how hard it can be. It is hard because nicotine is a very addictive drug. For some people, it can be as addictive as heroin or cocaine. Usually, people make 2 or 3 tries, or more, before finally being able to quit. Each time you try to quit, you can learn about what helps and what hurts. Quitting takes hard work and a lot of effort, but you can quit smoking. QUITTING SMOKING IS ONE OF THE MOST IMPORTANT THINGS YOU WILL EVER DO:  You will live longer, feel better, and live better.   The impact on your body of quitting smoking is felt almost immediately:   Within 20 minutes, blood pressure decreases. Pulse returns to its normal level.   After 8 hours, carbon monoxide levels in the blood return to normal. Oxygen level increases.   After 24 hours, chance of heart attack starts to decrease. Breath, hair, and body stop smelling like smoke.   After 48 hours, damaged  nerve endings begin to recover. Sense of taste and smell improve.   After 72 hours, the body is virtually free of nicotine. Bronchial tubes relax and breathing becomes easier.   After 2 to 12 weeks, lungs can hold more air. Exercise becomes easier and circulation improves.   Quitting will lower your chance of  having a heart attack, stroke, cancer, or lung disease:   After 1 year, the risk of coronary heart disease is cut in half.   After 5 years, the risk of stroke falls to the same as a nonsmoker.   After 10 years, the risk of lung cancer is cut in half and the risk of other cancers decreases significantly.   After 15 years, the risk of coronary heart disease drops, usually to the level of a nonsmoker.   If you are pregnant, quitting smoking will improve your chances of having a healthy baby.   The people you live with, especially your children, will be healthier.   You will have extra money to spend on things other than cigarettes.  FIVE KEYS TO QUITTING Studies have shown that these 5 steps will help you quit smoking and quit for good. You have the best chances of quitting if you use them together: 1. Get ready.  2. Get support and encouragement.  3. Learn new skills and behaviors.  4. Get medicine to reduce your nicotine addiction and use it correctly.  5. Be prepared for relapse or difficult situations. Be determined to continue trying to quit, even if you do not succeed at first.  1. GET READY  Set a quit date.   Change your environment.   Get rid of ALL cigarettes, ashtrays, matches, and lighters in your home, car, and place of work.   Do not let people smoke in your home.   Review your past attempts to quit. Think about what worked and what did not.   Once you quit, do not smoke. NOT EVEN A PUFF!  2. GET SUPPORT AND ENCOURAGEMENT Studies have shown that you have a better chance of being successful if you have help. You can get support in many ways.  Tell your family, friends, and coworkers that you are going to quit and need their support. Ask them not to smoke around you.   Talk to your caregivers (doctor, dentist, nurse, pharmacist, psychologist, and/or smoking counselor).   Get individual, group, or telephone counseling and support. The more counseling you have, the  better your chances are of quitting. Programs are available at [employer] and health centers. Call your local health department for information about programs in your area.   Spiritual beliefs and practices may help some smokers quit.   Quit meters are Photographer that keep track of quit statistics, such as amount of "quit-time," cigarettes not smoked, and money saved.   Many smokers find one or more of the many self-help books available useful in helping them quit and stay off tobacco.  3. LEARN NEW SKILLS AND BEHAVIORS  Try to distract yourself from urges to smoke. Talk to someone, go for a walk, or occupy your time with a task.   When you first try to quit, change your routine. Take a different route to work. Drink tea instead of coffee. Eat breakfast in a different place.   Do something to reduce your stress. Take a hot bath, exercise, or read a book.   Plan something enjoyable to do every day. Reward yourself for not  smoking.   Explore interactive web-based programs that specialize in helping you quit.  4. GET MEDICINE AND USE IT CORRECTLY Medicines can help you stop smoking and decrease the urge to smoke. Combining medicine with the above behavioral methods and support can quadruple your chances of successfully quitting smoking. The U.S. Food and Drug Administration (FDA) has approved 7 medicines to help you quit smoking. These medicines fall into 3 categories.  Nicotine replacement therapy (delivers nicotine to your body without the negative effects and risks of smoking):   Nicotine gum: Available over-the-counter.   Nicotine lozenges: Available over-the-counter.   Nicotine inhaler: Available by prescription.   Nicotine nasal spray: Available by prescription.   Nicotine skin patches (transdermal): Available by prescription and over-the-counter.   Antidepressant medicine (helps people abstain from smoking, but how this works is unknown):    Bupropion sustained-release (SR) tablets: Available by prescription.   Nicotinic receptor partial agonist (simulates the effect of nicotine in your brain):   Varenicline tartrate tablets: Available by prescription.   Ask your caregiver for advice about which medicines to use and how to use them. Carefully read the information on the package.   Everyone who is trying to quit may benefit from using a medicine. If you are pregnant or trying to become pregnant, nursing an infant, you are under age 23, or you smoke fewer than 10 cigarettes per day, talk to your caregiver before taking any nicotine replacement medicines.   You should stop using a nicotine replacement product and call your caregiver if you experience nausea, dizziness, weakness, vomiting, fast or irregular heartbeat, mouth problems with the lozenge or gum, or redness or swelling of the skin around the patch that does not go away.   Do not use any other product containing nicotine while using a nicotine replacement product.   Talk to your caregiver before using these products if you have diabetes, heart disease, asthma, stomach ulcers, you had a recent heart attack, you have high blood pressure that is not controlled with medicine, a history of irregular heartbeat, or you have been prescribed medicine to help you quit smoking.  5. BE PREPARED FOR RELAPSE OR DIFFICULT SITUATIONS  Most relapses occur within the first 3 months after quitting. Do not be discouraged if you start smoking again. Remember, most people try several times before they finally quit.   You may have symptoms of withdrawal because your body is used to nicotine. You may crave cigarettes, be irritable, feel very hungry, cough often, get headaches, or have difficulty concentrating.   The withdrawal symptoms are only temporary. They are strongest when you first quit, but they will go away within 10 to 14 days.  Here are some difficult situations to watch  for:  Alcohol. Avoid drinking alcohol. Drinking lowers your chances of successfully quitting.   Caffeine. Try to reduce the amount of caffeine you consume. It also lowers your chances of successfully quitting.   Other smokers. Being around smoking can make you want to smoke. Avoid smokers.   Weight gain. Many smokers will gain weight when they quit, usually less than 10 pounds. Eat a healthy diet and stay active. Do not let weight gain distract you from your main goal, quitting smoking. Some medicines that help you quit smoking may also help delay weight gain. You can always lose the weight gained after you quit.   Bad mood or depression. There are a lot of ways to improve your mood other than smoking.  If you are having  problems with any of these situations, talk to your caregiver. SPECIAL SITUATIONS OR CONDITIONS Studies suggest that everyone can quit smoking. Your situation or condition can give you a special reason to quit.  Pregnant women/New mothers: By quitting, you protect your baby's health and your own.   Hospitalized patients: By quitting, you reduce health problems and help healing.   Heart attack patients: By quitting, you reduce your risk of a second heart attack.   Lung, head, and neck cancer patients: By quitting, you reduce your chance of a second cancer.   Parents of children and adolescents: By quitting, you protect your children from illnesses caused by secondhand smoke.  QUESTIONS TO THINK ABOUT Think about the following questions before you try to stop smoking. You may want to talk about your answers with your caregiver.  Why do you want to quit?   If you tried to quit in the past, what helped and what did not?   What will be the most difficult situations for you after you quit? How will you plan to handle them?   Who can help you through the tough times? Your family? Friends? Caregiver?   What pleasures do you get from smoking? What ways can you still get  pleasure if you quit?  Here are some questions to ask your caregiver:  How can you help me to be successful at quitting?   What medicine do you think would be best for me and how should I take it?   What should I do if I need more help?   What is smoking withdrawal like? How can I get information on withdrawal?  Quitting takes hard work and a lot of effort, but you can quit smoking. FOR MORE INFORMATION Smokefree.gov (http://www.davis-sullivan.com/) provides free, accurate, evidence-based information and professional assistance to help support the immediate and long-term needs of people trying to quit smoking. Document Released: 03/15/2001 Document Re-Released: 09/08/2009 [facility] Patient Information 2011 [place], [place].

## 2010-07-23 ENCOUNTER — Telehealth: Payer: Self-pay | Admitting: Family Medicine

## 2010-07-23 NOTE — Telephone Encounter (Signed)
Pt prescribed allegra & nasonex, medicaid does not cover, is requesting something medicaid will cover. Pt goes to [facility].

## 2010-07-23 NOTE — Telephone Encounter (Signed)
To MD

## 2010-07-27 MED ORDER — FLUTICASONE FUROATE 27.5 MCG/SPRAY NA SUSP: 2.0000 | Freq: Every day | NASAL | Status: DC

## 2010-07-27 MED ORDER — LORATADINE 10 MG PO TABS: 10.0000 mg | ORAL_TABLET | Freq: Every day | ORAL | Status: DC

## 2010-07-27 NOTE — Telephone Encounter (Signed)
To preceptor for today

## 2010-07-27 NOTE — Telephone Encounter (Signed)
Patient checking status, wants to know if another MD could change the meds?

## 2010-07-27 NOTE — Telephone Encounter (Signed)
-----   Message from Sarah T. [place] sent at 07/27/2010 10:56 AM -----  Rx sent for fluticasone and loratadine, which are on Medicaid preferred med list.    Pt mom informed that "New Rx was Sent to her pharmacy" Claxton Levitz, Maryjo Rochester

## 2010-07-28 NOTE — Telephone Encounter (Signed)
Pt just called pharmacy & they never received the 2 rxs prescribed yesterday, pt asking for RN to call pharmacy & call in over the phone.

## 2010-07-28 NOTE — Telephone Encounter (Signed)
[facility] myself and they have received the Rx's, and received them yesterday electronically.  Advised patient and she will go to pick up

## 2010-08-17 NOTE — Discharge Summary (Signed)
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  Patient reports no complaints today.  She is not taking any  birth control at this time.  Her Depo ran out at the end of December and  is desiring a pregnancy at this time.   Upon assessment:  VITAL SIGNS:  99.2 temp.  Pulse 76.  Blood pressure 121/78.  Weight is  163 pounds and 5 feet 4 inches.  GENERAL:  Patient is a well-developed, well-nourished black female in no  acute distress.  GENITOURINARY EXAM:  Normal external female genitalia.  The vagina is  pink.  No lesions and rugated.  No Bartholin's or Skene's discharge.  The cervix is parous without abnormal discharge.  No abnormal lesions  seen.  Smooth appearance.   IMPRESSION:  History of abnormal Pap.   PLAN:  Repeat Pap smear today and then repeat again in 4 months and if  this is normal we will get a Pap smear every 6 months for 1 year and  then back to yearly Paps.  The TSH level that was drawn for the last is  due to a goiter being noticed and was within normal limits.      EAVWUJW [name], CNM      Ginger Carne, MD  Electronically Signed    WM/MEDQ  D:  05/30/2007  T:  05/31/2007  Job:  [id number]

## 2010-08-17 NOTE — Group Therapy Note (Signed)
NAME:  Brandi Sanders, WISE NO.:  [id number]   MEDICAL RECORD NO.:  [id number]          PATIENT TYPE:  WOC   LOCATION:  [facility]                   FACILITY:  [facility]   PHYSICIAN:  Tinnie Gens, MD        DATE OF BIRTH:  October 20, 1985   DATE OF SERVICE:  11/16/2006                                  CLINIC NOTE   CHIEF COMPLAINT:  Follow up Pap.   HISTORY OF PRESENT ILLNESS:  The patient is a 25 year old gravida 1,  para 1 who had CIN-2 and had cryotherapy in May of 2008.  She is back  today for a followup Pap smear.  She is without complaint today.  She  continues on Depo-Provera for birth control.   PHYSICAL EXAMINATION:  VITAL SIGNS:  Temp is 98.3, pulse 84, blood  pressure 123/78, weight 154.  GENERAL:  She is a well-developed, well-nourished female in no acute  distress.  GU:  Normal external female genitalia.  The vagina is pink and rugated.  BUS, normal  The cervix is parous without lesions consistent with  previous cryo.   IMPRESSION:  History of abnormal Pap.   PLAN:  Repeat Pap today and in 4 months and in 4 months ago.  If these  are normal, we will get q.6 month Paps for 1 year and then back to  yearly Paps.  Continue Depo-Provera as needed.  The patient also has a  goiter but has a normal TSH.  This will be followed up with [facility].           ______________________________  Tinnie Gens, MD     TP/MEDQ  D:  11/16/2006  T:  11/17/2006  Job:  [id number]

## 2010-08-20 NOTE — Group Therapy Note (Signed)
NAME:  Brandi Sanders, ROSTAD NO.:  [id number]   MEDICAL RECORD NO.:  [id number]          PATIENT TYPE:  WOC   LOCATION:  [facility]                   FACILITY:  [facility]   PHYSICIAN:  Tinnie Gens, MD        DATE OF BIRTH:  1985/12/18   DATE OF SERVICE:                                  CLINIC NOTE   CHIEF COMPLAINT:  Abnormal Pap.   HISTORY OF PRESENT ILLNESS:  The patient is a 25 year old gravida 1,  para 1, who underwent colposcopy for abnormal Pap smear, and was found  to have CIN-2 by biopsy.  She was scheduled for cryo and she presents  today for that.   PHYSICAL EXAMINATION:  She is noted to have an enlarged thyroid with a  lot of fullness in the anterior neck.  She has no symptoms of  hypothyroidism currently.  Blood pressure is 116/65, weight is 154, temp  98, pulse 104.  She is a well-developed, well-nourished female in no acute distress.  GENITOURINARY:  Normal external female genitalia.  The vagina is pink  and rugated.  The cervix is parous without lesions.   PROCEDURE:  The cervix was frozen for 3 minutes, thawed for 5 minutes,  re-frozen for 5 minutes.  The patient tolerated the procedure well.   IMPRESSION:  1. Status post cryo for CIN-2.  2. Enlarged thyroid.   PLAN:  1. Followup Pap in 4 months.  2. Check TSH.           ______________________________  Tinnie Gens, MD     TP/MEDQ  D:  06/15/2006  T:  06/17/2006  Job:  [id number]

## 2010-09-22 ENCOUNTER — Ambulatory Visit: Payer: Medicaid Other | Admitting: Family Medicine

## 2010-09-23 ENCOUNTER — Encounter: Payer: Self-pay | Admitting: Family Medicine

## 2010-09-23 ENCOUNTER — Ambulatory Visit (INDEPENDENT_AMBULATORY_CARE_PROVIDER_SITE_OTHER): Payer: Medicaid Other | Admitting: Family Medicine

## 2010-09-23 VITALS — BP 137/86 | HR 106 | Temp 98.4°F | Ht 64.5 in | Wt 173.0 lb

## 2010-09-23 DIAGNOSIS — R1033 Periumbilical pain: Secondary | ICD-10-CM

## 2010-09-23 MED ORDER — POLYETHYLENE GLYCOL 3350 17 GM/SCOOP PO POWD: 17.0000 g | Freq: Every day | ORAL | Status: AC

## 2010-09-23 NOTE — Patient Instructions (Signed)
It was nice to meet you today!  Start Miralax to soften your bowel movements and follow up with Dr. Dwain Sarna.

## 2010-09-24 ENCOUNTER — Encounter: Payer: Self-pay | Admitting: Family Medicine

## 2010-09-24 NOTE — Progress Notes (Signed)
  Subjective:    Patient ID: Brandi Sanders, female    DOB: 11/15/1985, 25 y.o.   MRN: [id number]  HPI  1. Abdominal Pain: Periumbilical. For several months. Hx of umbilical hernia repair last August by Dr. Dwain Sarna. Since then, has still had some pain. Over the last few months, the pain is worse when having a bowel movement and sometimes after eating. Always an ache, sometimes sharp. No fever/chills, N/V/D. Endorses constipation, hard stools. No melena, BRBPR.   Review of Systems SEE HPI.    Objective:   Physical Exam  Vitals reviewed. Constitutional: She appears well-developed and well-nourished. No distress.  Cardiovascular: Normal rate and regular rhythm.   Pulmonary/Chest: Effort normal and breath sounds normal.  Abdominal: Soft. Bowel sounds are normal. She exhibits no distension and no mass. There is no rebound and no guarding.       Mild TTP periumbilical.      Assessment & Plan:

## 2010-09-24 NOTE — Assessment & Plan Note (Signed)
Seems chronic. Discussed managing constipation with fiber, H2O, and Miralax. Patient may benefit from follow-up with Dr. Dwain Sarna as well as not all symptoms can be attributed to constipation.

## 2010-10-19 ENCOUNTER — Ambulatory Visit (INDEPENDENT_AMBULATORY_CARE_PROVIDER_SITE_OTHER): Payer: Medicaid Other | Admitting: Family Medicine

## 2010-10-19 ENCOUNTER — Encounter: Payer: Self-pay | Admitting: Family Medicine

## 2010-10-19 VITALS — BP 144/82 | HR 108 | Temp 98.1°F | Wt 172.0 lb

## 2010-10-19 DIAGNOSIS — F329 Major depressive disorder, single episode, unspecified: Secondary | ICD-10-CM | POA: Insufficient documentation

## 2010-10-19 MED ORDER — QUETIAPINE FUMARATE 50 MG PO TABS: 50.0000 mg | ORAL_TABLET | Freq: Every day | ORAL | Status: DC

## 2010-10-19 NOTE — Progress Notes (Signed)
Subjective:   Brandi Sanders is an 25 y.o. female who presents for evaluation and treatment of depressive symptoms, and concerns for bipolar symptoms.   Onset approximately 3 years ago, gradually worsening since that time.  Current symptoms include depressed mood, anhedonia, insomnia, hypersomnia, fatigue, feelings of worthlessness/guilt, difficulty concentrating, suicidal thoughts without plan and decreased appetite.  She also endorses irratability, hyper activity that got her into trouble, times feeling self-confident, times where she goes without sleep, hypersexuality, spending excessive money (a few thousand dollars).  She says that she becomes very angry sometimes, at her fiance or at her children.  She says that she got in a disagreement with her manager at work and they had to let her go.    Patient is very worried about her symptoms and concerned about losing her fiance and children if she does not get better.   Current treatment for depression:None Sleep problems: Moderate   Energy: Poor Motivation: Poor Concentration: Poor Rumination/worrying: Marked Memory: Fair Tearfulness: Moderate  Anxiety: Mild  Panic: Mild  Suicidal ideation: Absent  Other/Psychosocial Stressors: unemployed Family history positive for depression: negative Previous treatment modalities employed include None.  Past episodes of depression:none Organic causes of depression present: Marijuanna use.  Review of Systems Pertinent items are noted in HPI.   Objective:   Mental Status Examination: Posture and motor behavior: Appropriate Dress, grooming, personal hygiene: Appropriate Facial expression: Appropriate Speech: Positive for slow speech Mood: Positive for depressed mood Coherency and relevance of thought: Appropriate Thought content: Appropriate Perceptions: Appropriate Orientation:Appropriate Attention and concentration: Appropriate Memory: : Appropriate Vocabulary: Appropriate Abstract  reasoning: Appropriate Judgment: Appropriate   Affect: tearful/sad  PHQ 9: 23 MDQ: 14  Assessment:   Experiencing the following symptoms of depression most of the day nearly every day for more than two consecutive weeks: depressed mood, loss of interests/pleasure, change in sleep, loss of energy, trouble concentrating, thoughts of worthlessness or guilt  Depressive Disorder vs. Bipolar disorder Patient meets criteria for Major Depressive disorder, but also endorses symptoms concerning for bipolar spectrum disorders. She is not currently suicidal. She does have a good support system and we discussed a safety plan (see pt instructions).     Suicide Risk Assessment:  Suicidal intent: not currently Suicidal plan:never  Access to means for suicide:no Lethality of means for suicide: no plan Prior suicide attempts: none Recent exposure to suicide:none   Plan:    1. Depressive disorder  QUEtiapine (SEROQUEL) 50 MG tablet  vs. Bipolar disorder- do not want to start SSRI in setting of concern for bipolar.  Will start seroquel at a low dose today.  Pt can contract for safety.    Will refer pt to therapy as well as mood disorder clinic for further evaluation and treatment.   Reviewed concept of depression as biochemical imbalance of neurotransmitters and rationale for treatment. Instructed patient to contact office or on-call physician promptly should condition worsen or any new symptoms appear and provided on-call telephone numbers.

## 2010-10-19 NOTE — Patient Instructions (Signed)
It was nice to meet you.  I am sorry you have been feeling so down lately and your mood swings have been so hard to control.  Remember to call Dr. [name] tomorrow morning to talk about scheduling you for Therapy and for Mood disorder clinic.    I am also starting you on a medication called Seroquel.  It is an antidepressant, but it can make you sleepy so take it at bedtime.    If you are feeling like you might hurt yourself, or someone else, please remember to contact people you can trust- your Fiance, your mom, your sisters, your close friend.  Also, remember you can call the crisis line at [phone], or 911.    Please make an appointment to see me again in one month.

## 2010-10-20 ENCOUNTER — Telehealth: Payer: Self-pay | Admitting: Psychology

## 2010-10-20 ENCOUNTER — Other Ambulatory Visit: Payer: Self-pay | Admitting: Psychology

## 2010-10-20 NOTE — Progress Notes (Signed)
Erroneous encounter

## 2010-10-20 NOTE — Telephone Encounter (Signed)
Patient called to schedule [facility] appt (August 1st at 9:30) and [facility] appt (August 6th at 11:00).  Dr. Lula Olszewski recently started her on Seroquel 50 mg.  Discussed case with Dr. Kathrynn Running today in [facility] who recommended the following titration in order to manage the depressive symptoms effectively.  50 mg on the first night; 100 mg the second night; 200 mg the third night and then 300 mg.  Discussed with Dr. Lula Olszewski who agreed to the recommendation.  I phone the patient and provided the instruction. Warned against daytime somnolence and orthostatic issues.  Dr. Lula Olszewski will send an electronic prescription for Seroquel 300 mg to [facility] pharmacy.  Dr. Lula Olszewski plans to follow up via a phone call in a few days to look at tolerability, safety and efficacy.  Will see in my clinics as above.

## 2010-10-21 MED ORDER — QUETIAPINE FUMARATE 300 MG PO TABS: 300.0000 mg | ORAL_TABLET | Freq: Every day | ORAL | Status: DC

## 2010-10-21 NOTE — Telephone Encounter (Signed)
Addended by: Ardyth Gal on: 10/21/2010 10:59 AM   Modules accepted: Orders

## 2010-10-29 ENCOUNTER — Telehealth: Payer: Self-pay | Admitting: *Deleted

## 2010-10-29 NOTE — Telephone Encounter (Signed)
PA required for Quetiapine Fumarate even though it is on the preferred drug list , just because it is an antipsychotic drug. Form placed in MD box.Marland Kitchen

## 2010-11-01 ENCOUNTER — Other Ambulatory Visit: Payer: Self-pay | Admitting: Family Medicine

## 2010-11-01 NOTE — Telephone Encounter (Signed)
Medicaid is not clearing the Seroquel for the higher dose for a certain number of days.  Has been off of the medication for several days and is noticing changes and needs to speak to someone.

## 2010-11-01 NOTE — Telephone Encounter (Signed)
Spoke with patient and she has been off med since last Thurdsay 10/28/2010. States she is beginning to have mood swings, migraine headache, poor appetite and not sleeping.  Paged Dr. Lula Olszewski. PA was filled out and faxed Friday 10/29/2010.  Advised patient if she can, purchase a few tabs to last until we can hear back form medicaid. Called medicaid and they have no record of it . Faxed again. Advised MD of patient's message . Told patient that  it was faxed again and should hear back from it today.

## 2010-11-01 NOTE — Telephone Encounter (Signed)
Larita Fife,   Do you know anything about this?  Is it a PA? Honestee Revard, Maryjo Rochester

## 2010-11-02 NOTE — Telephone Encounter (Signed)
PA was approved, pharmacy notified  

## 2010-11-02 NOTE — Telephone Encounter (Signed)
PA has been approved. Pharmacy notified.  

## 2010-11-03 ENCOUNTER — Encounter (INDEPENDENT_AMBULATORY_CARE_PROVIDER_SITE_OTHER): Payer: Self-pay | Admitting: General Surgery

## 2010-11-03 ENCOUNTER — Ambulatory Visit (INDEPENDENT_AMBULATORY_CARE_PROVIDER_SITE_OTHER): Payer: Medicaid Other | Admitting: Psychology

## 2010-11-03 DIAGNOSIS — F39 Unspecified mood [affective] disorder: Secondary | ICD-10-CM | POA: Insufficient documentation

## 2010-11-03 LAB — COMPREHENSIVE METABOLIC PANEL
ALT: 24 U/L (ref 0–35)
Alkaline Phosphatase: 50 U/L (ref 39–117)
CO2: 22 mEq/L (ref 19–32)
Calcium: 9.6 mg/dL (ref 8.4–10.5)
Creat: 0.74 mg/dL (ref 0.50–1.10)
Sodium: 138 mEq/L (ref 135–145)
Total Bilirubin: 0.3 mg/dL (ref 0.3–1.2)

## 2010-11-03 LAB — LIPID PANEL: HDL: 64 mg/dL (ref >39)

## 2010-11-03 LAB — TSH: TSH: 3 u[IU]/mL (ref 0.350–4.500)

## 2010-11-03 NOTE — Patient Instructions (Signed)
I look forward to seeing you and your fiance at your therapy appointment for:  August 6th.  We will discuss your smoking and develop a plan to help you get healthier in this regard.  You and Jess Barters may want to consider what smoking does for you and what the drawbacks are and what would make it really tough to give up. Dr. Kathrynn Running recommended you take your Seroquel around an hour before bedtime.  Having a consistent bedtime is one thing that might REALLY help your mood as well.

## 2010-11-03 NOTE — Progress Notes (Signed)
Brandi Sanders presents for the first time to [facility].  She was scheduled for a 30 minutes slot secondary to Dr. Melina Modena earlier assessment.  ? Major Depressive episode vs. Bipolar Spectrum Disorder.  Patient started on Seroquel at that visit and has titrated up to 300 mg.  Unfortunately, she was out of her medication for four days (had to do with Medicaid approval) and is just one day back on 300 mg.  She is experiencing fatigue this morning.  Per her report, she would like help with her mood swings.  She has periods of hypersexuality coupled with racing thoughts, positive mood, increased self-confidence and desire to be social.  These are largely positive experiences (for her) and are relatively infrequent.  More frequent is irritable mood accompanied by arguing and yelling.  She also experiences periods of time with fatigue and lack of motivation and feelings of worthlessness.  Family history is positive for bipolar disorder and schizophrenia in her father.  Denies issues in her mother.  Early onset of mood issues - she remembers in middle school she was irritable and often started fights.  She dropped out of school at age 38 secondary to pregnancy.  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  Eventually argued with her manager and was fired.  Brandi Sanders is most concerned about the impact her mood has on her fiance (father of both of her children) and her children (ages 66 and 2).  Of note, Brandi Sanders reports heavy, daily use of marijuana beginning at age 67 or 15.  She stopped or cut back considerably during both of her pregnancies.  She uses the marijuana primarily to decrease her irritability.  Her fiance smokes as well.  Denies use of other drugs including alcohol.

## 2010-11-03 NOTE — Assessment & Plan Note (Addendum)
Naydeen is adequately groomed and appropriately dressed.  She maintains good eye contact and is cooperative and attentive.  She appears fatigued and / or to have some psychomotor retardation.  Mood is depressed with a consistent affect.  Thought process is logical and goal directed.  No evidence of suicidal or homicidal ideation.  Does not appear to be responding to any internal stimuli.  Able to maintain train of thought and concentrate on the questions.  Judgment and insight are average.  Currently unable to definitively diagnose a mood disorder secondary to her substance use.  Family history and early age of onset of mood / behavioral problems makes a mood disorder likely.  Symptoms are highly consistent with a Bipolar Spectrum Disorder however, her level of marijuana use could account for some of this.  Provided that feedback and she voiced an understanding.  She would like to d/c the marijuana - primarily for hte sake of her children.  She has no plan to do this.  She does not want a referral for help.  She is willing to talk about it in therapy.  Her fiance would like to quit too.  See patient instructions for further plan.  Dr. Kathrynn Running thinks Seroquel remains a good choice even though the diagnosis is not yet definitive.  Somnolence should attenuate over time if she is able to take the medicine consistently.  In addition, her sleep schedule is currently erratic (bedtime anywhere between 9:30 p.m. And 2:00 a.m.).  Going to bed at a set time would likely help her mood and her feelings of fatigue.  Behavior change is hard.  Patient agreed with plan.  Will follow her in [facility] as needed.  Presently - focus is on marijuana use and medicine will stay the same.

## 2010-11-04 ENCOUNTER — Telehealth: Payer: Self-pay | Admitting: Psychology

## 2010-11-04 ENCOUNTER — Encounter (INDEPENDENT_AMBULATORY_CARE_PROVIDER_SITE_OTHER): Payer: Self-pay | Admitting: General Surgery

## 2010-11-04 NOTE — Telephone Encounter (Signed)
Will let her know her lab results.  Plan is to recheck in three months per Dr. Kathrynn Running.

## 2010-11-08 ENCOUNTER — Ambulatory Visit: Payer: Medicaid Other | Admitting: Psychology

## 2010-11-08 ENCOUNTER — Telehealth: Payer: Self-pay | Admitting: Psychology

## 2010-11-08 NOTE — Telephone Encounter (Signed)
Brandi Sanders called at 9:40 this a.m. To cancel her 11:00 appt.  She wished to reschedule.  First available was September 3rd.  Scheduled for 11:00 a.m.

## 2010-12-09 ENCOUNTER — Ambulatory Visit (INDEPENDENT_AMBULATORY_CARE_PROVIDER_SITE_OTHER): Payer: Self-pay | Admitting: Psychology

## 2010-12-09 DIAGNOSIS — F39 Unspecified mood [affective] disorder: Secondary | ICD-10-CM

## 2010-12-09 NOTE — Progress Notes (Signed)
Zaelyn presents today with her fiance, Jess Barters and their 24 yo son.  She continues to take 300 mg of Seroquel and has noticed that she is not as depressed.  She is less tearful, less down days and less times feeling worthless.  In addition, she has noticed some shift in her irritability but both she and her fiance say it is not nearly enough.  She would like to volunteer at her daughter's school but is afraid she will not be able to manage her irritability around others.  She reports that between 1.5 and 2 weeks ago she stopped marijuana completely.  She had been decreasing up until that point and stopped all together.  She reports she is managing it well with few cravings except when her fiance comes in smelling like it.  He has cut back as well but still smokes out with friends.  He identified a desire to quit all together.  His motivation lies with wanting to be the best dad possible to his kids and not wanting to rely on a crutch.  Edin says she spends most of the day sleeping.  Occasionally she will get up and clean.  Her activity other than this is limited.  She sleeps at night as well.  She says this is NOT new since starting the Seroquel and it predated stopping the marijuana as well.  Mostly, she says she doesn't have anything else to do and sleeping keeps her from acting out her irritability.

## 2010-12-09 NOTE — Patient Instructions (Signed)
Please schedule a follow-up in our mood clinic (to discuss medications) on October 17th at 10:30. Please schedule a follow-up with me (Dr. [name]) on:  September 20th at 3:00. CONGRATULATIONS!!!  Awesome job.  Now we just need to motivate your fiance a wee bit more.  Sounds like he has plenty of good reasons to quit completely. Medication - sounds like you have some modest improvement and that you are tolerating it okay.  I will speak to Dr. Kathrynn Running and Lula Olszewski to see where to go from here.  I think your brain is still adjusting to not having the MJ on board.  So we want to proceed cautiously. Exercise is a wonderful activity you can do that doesn't involve other people.  It will help manage your mood as well as improve your health overall.  You thought you could exercise about 3-4 days a week on the X-box for about 30 minutes to an hour (!!).   Remember two things:  Just do it (if you think about it too much you can talk yourself out of it) and the 5 minute rule.

## 2010-12-09 NOTE — Assessment & Plan Note (Signed)
Report of mood remains irritable.  Decreased depression however.  Affect appears within normal limits.  She laughed appropriately.  Sounds like modest improvement on medication.  Sleepiness does not seem medication related and she agrees with this assessment so will say she is tolerating the medicine at this dose.  She does not note a significant increase in appetite.  Will check with Dr. Kathrynn Running but would guess an increase in dose of Seroquel would be appropriate.  First time she can attend [facility] is October.  Will discuss if Dr. Lula Olszewski could make the dose adjustment at their next appointment.  Stopping marijuana use is a huge behavior change.  She seems fairly confident with this but I did uncover some triggers.  She is not worried about relapse at this point.  Will continue to monitor in [facility] clinic.  See patient instructions for further plan.

## 2010-12-15 ENCOUNTER — Ambulatory Visit (INDEPENDENT_AMBULATORY_CARE_PROVIDER_SITE_OTHER): Payer: Self-pay | Admitting: Family Medicine

## 2010-12-15 ENCOUNTER — Encounter: Payer: Self-pay | Admitting: Family Medicine

## 2010-12-15 DIAGNOSIS — F3289 Other specified depressive episodes: Secondary | ICD-10-CM

## 2010-12-15 DIAGNOSIS — F39 Unspecified mood [affective] disorder: Secondary | ICD-10-CM

## 2010-12-15 DIAGNOSIS — F329 Major depressive disorder, single episode, unspecified: Secondary | ICD-10-CM

## 2010-12-15 MED ORDER — QUETIAPINE FUMARATE 400 MG PO TABS: 400.0000 mg | ORAL_TABLET | Freq: Every day | ORAL | Status: DC

## 2010-12-15 NOTE — Progress Notes (Signed)
Subjective:     Brandi Sanders is a 25 y.o. female who presents for follow up of depression. Current symptoms include anhedonia, depressed mood, fatigue, hypersomnia and and irritability.. Symptoms have been gradually improving since that time. Patient denies feelings of worthlessness/guilt and suicidal thoughts with specific plan, she states the depressed mood has improved some since starting Seroquel but complains that she is still very irritable, and is afraid to interact with people who do not know her well because of it. Previous treatment includes: individual therapy and medication. She complains of the following side effects from the treatment: weight gain.  The following portions of the patient's history were reviewed and updated as appropriate: allergies, current medications, past family history, past medical history, past social history, past surgical history and problem list.  Review of Systems Pertinent items are noted in HPI.    Objective:    BP 131/79  Pulse 115  Temp(Src) 98.1 F (36.7 C) (Oral)  Wt 183 lb 4.8 oz (83.144 kg)  General:  alert, cooperative and no distress  Affect & Behavior:  good grooming, good insight, normal perception, normal reasoning, normal speech pattern and content, normal thought patterns and flat affect, but improved from last visit. flattened affect    PHQ9 = 16 MDQ= 5  Assessment:    Depression, gradually improving      Plan:    Medications: Increase Seroquel to 400 mg po daily.  Discussed with patient need to let brain "reset" since quitting Marijuana. . Continue with counseling. Instructed patient to contact office or on-call physician promptly should condition worsen or any new symptoms appear and provided on-call telephone numbers. IF THE PATIENT HAS ANY SUICIDAL OR HOMICIDAL IDEATIONS, CALL THE OFFICE, DISCUSS WITH A SUPPORT MEMBER, OR GO TO THE ER IMMEDIATELY. Patient was agreeable with this plan.

## 2010-12-15 NOTE — Patient Instructions (Signed)
It was nice to see you.  I am glad you are feeling a little bit better. I am increasing your Seroquel from 300 mg at bedtime to 400 mg at bedtime, the prescription has been sent to the pharmacy.  I want you to try to set some goals about how you spend your time- pick active things you like to do, and ask your fiance and your best friend to help you.  Also, try to increase your exercise to every day.    Please make an appointment to see me again in 3-4 weeks, around the beginning of October.

## 2010-12-23 ENCOUNTER — Encounter: Payer: Self-pay | Admitting: Psychology

## 2010-12-23 ENCOUNTER — Ambulatory Visit (INDEPENDENT_AMBULATORY_CARE_PROVIDER_SITE_OTHER): Payer: Self-pay | Admitting: Psychology

## 2010-12-23 DIAGNOSIS — F39 Unspecified mood [affective] disorder: Secondary | ICD-10-CM

## 2010-12-23 LAB — I-STAT 8, (EC8 V) (CONVERTED LAB)
Acid-base deficit: 2
BUN: 6
Bicarbonate: 23.1
HCT: 44
Hemoglobin: 15
Operator id: 263631
Sodium: 138
TCO2: 24

## 2010-12-23 LAB — POCT PREGNANCY, URINE: Preg Test, Ur: NEGATIVE

## 2010-12-23 LAB — POCT I-STAT CREATININE: Creatinine, Ser: 0.8

## 2010-12-23 LAB — D-DIMER, QUANTITATIVE: D-Dimer, Quant: 0.99 — ABNORMAL HIGH

## 2010-12-23 NOTE — Progress Notes (Signed)
Brandi Sanders present for follow-up.  She increased her Seroquel to 400 mg and it tolerating it well.  She has not noticed any change in irritability.  She continues to abstain from marijuana (one strategy she uses is to remind herself she is not allowed to smoke while taking medication).  She is exercising 2-3 times a week on her Xbox.  She notices with this that she has better energy but still not shift in mood or irritability.  She has not lashed out at anybody with the exception of her fiance and children.  She says she does not have many opportunities to do so however so this is not a good marker for her.

## 2010-12-23 NOTE — Assessment & Plan Note (Signed)
Report of mood remains irritable.  Affect is bright - definitely brighter than initial presentation.  She appears engaged in this process.  She also seems to have a good understanding and acceptance of the treatment plan.  Noted a significant increase in weight since starting Seroquel.  Checked weight today and it was 179 which is still a 7 pound increase but not as significant as her last (12/15/10) weight which was 183.  Will continue to monitor.  Next point will be around the two month mark since starting Seroquel.  Dr. Kathrynn Running recommended waiting 2-3 months after marijuana cessation to gauge response.  Will touch base with him again to see next steps.  See patient instructions for further plan.

## 2010-12-23 NOTE — Patient Instructions (Signed)
Please schedule a follow-up for:  October 8th at 11:00. We discussed staying the course with the Seroquel for now.  I will keep track of the two-month mark and revisit medication after your brain has had a chance to recover. I am sorry that you haven't noticed much improvement with the irritability.  The exercise seems to provide some good energy.  More exercise (more days of the week) might bring a positive effect on mood and would also help with weight gain. Speaking of which, I noticed a trend since starting the Seroquel.  Let's keep our eye on this. You mentioned drawing as a skill of yours and one that has brought pleasure in the past.  You haven't done it in several years and yet had planned to do portraits of your kids.  You agreed to experiment to see if drawing would help increase your activity and bring you focus.  Feeling more positively about yourself may allow you to feel more positively about others.  You thought you could draw about 30 minutes to an hour most days of the week.  I would LOVE to see any drawings you do.

## 2010-12-29 LAB — URINALYSIS, ROUTINE W REFLEX MICROSCOPIC
Bilirubin Urine: NEGATIVE
Glucose, UA: NEGATIVE
Hgb urine dipstick: NEGATIVE
Ketones, ur: 15 — AB
Specific Gravity, Urine: 1.02
pH: 6.5

## 2010-12-29 LAB — POCT PREGNANCY, URINE: Preg Test, Ur: POSITIVE

## 2010-12-29 LAB — GC/CHLAMYDIA PROBE AMP, GENITAL
Chlamydia, DNA Probe: NEGATIVE
GC Probe Amp, Genital: NEGATIVE

## 2010-12-29 LAB — WET PREP, GENITAL
Clue Cells Wet Prep HPF POC: NONE SEEN
Trich, Wet Prep: NONE SEEN

## 2010-12-30 LAB — URINALYSIS, ROUTINE W REFLEX MICROSCOPIC
Glucose, UA: 100 — AB
Ketones, ur: >80 — AB
Specific Gravity, Urine: 1.015
pH: 7.5

## 2010-12-30 LAB — URINE MICROSCOPIC-ADD ON

## 2011-01-10 ENCOUNTER — Ambulatory Visit (INDEPENDENT_AMBULATORY_CARE_PROVIDER_SITE_OTHER): Payer: Self-pay | Admitting: Psychology

## 2011-01-10 DIAGNOSIS — F39 Unspecified mood [affective] disorder: Secondary | ICD-10-CM

## 2011-01-10 NOTE — Progress Notes (Signed)
Brandi Sanders presents for follow-up of her mood disorder.  She is two months out from stopping [facility] and is managing this well.  She feels good about it.  Her fiance is doing better regarding his use but has not stopped completely.  She continues to exercise.    She tried drawing as we discussed in her last appointment but had trouble focusing and got easily frustrated so she stepped away from it.  She reported that she has started classes to get her GED.  She has wanted to do this "forever" both as a sense of accomplishment and also as a model for her kids but she never pursued it.  She has taken placement tests (and has done well) and will do four consecutive night classes of "preparing for success."  These start tonight.  She is doing this with her sister which is helpful.  She has not noted increased irritability in this public setting as of yet.  She says she doesn't really have to interact - more just listen so this has been helpful.  She makes sure she is rested and fed before she goes and reminds herself of her goal - these are all strategies she cooked up on her own (!!).   With regards to mood at home, she reports that she is more active during the day (chasing after her son) and yelling less.  She notes feeling irritable or angry at times and making a choice not to act on it.

## 2011-01-10 NOTE — Assessment & Plan Note (Signed)
Report of mood remains irritable with some improvement noted.  Affect is typical for [facility].  She seems a little flat but I think this is her normal presentation.  Thoughts are clear and goal directed.  Sounds like she has more emotional control and initiative than previously.  Looking at function - going to get her GED after contemplating it so long is a nice marker.  Sounds like a paper sent to her home was the trigger and yet I suspect she has had similar opportunities in the past that she has not pursued.  Given current level of function, did some brief CBT work today.  First notes irritability in her head.  Asked her to think of a recent example and looked at the thoughts and behaviors associated with it.  See patient instructions for further plan.

## 2011-01-10 NOTE — Patient Instructions (Signed)
You have a follow up in [facility] on October 17th at 10:30.  We will discuss your medication at that time. Great job with the continued success with smoking and exercise. Also - congratulations on pursuing your GED.  Sounds like this was a long time in the making.  Glad things have come together for you to get this done. We talked about a Cognitive-Behavioral approach to helping your irritability.  Now that you have some time (before you act out) to make choices, you may want to take a look at your thoughts and evaluate them. Next time you feel irritable, ask yourself, "what is going through my head right now?"  Try to decide whether the thought is accurate or reasonable. If it is - look to be assertive by setting limits (for example with your daughter). If the thought is unreasonable, look to change your behavior (for instance, turn off the television, give some undivided attention, answer a question).  When you act with emotional control, you will feel even better.

## 2011-01-19 ENCOUNTER — Ambulatory Visit (INDEPENDENT_AMBULATORY_CARE_PROVIDER_SITE_OTHER): Payer: Self-pay | Admitting: Psychology

## 2011-01-19 DIAGNOSIS — F39 Unspecified mood [affective] disorder: Secondary | ICD-10-CM

## 2011-01-19 MED ORDER — QUETIAPINE FUMARATE 100 MG PO TABS: 100.0000 mg | ORAL_TABLET | Freq: Every day | ORAL | Status: DC

## 2011-01-19 NOTE — Patient Instructions (Signed)
Please schedule a follow-up for:  October 29th at 3:00. Dr. Kathrynn Running increased your Seroquel dose to 500 mg.  Take one 400 mg and one 100 mg at night. I gave you two mood charts.  There is a space to rate irritability on a scale of 0-3.  Please have Jess Barters and you rate daily for two weeks.  This will give [place] a good sense of whether the increase is worth it. Great job with the exercise.  This is so important in managing the possible medication effects.

## 2011-01-19 NOTE — Assessment & Plan Note (Signed)
Report of mood remains mostly irritable with improvements stable as noted in last beh-med note.  No reported sadness or anxiety / fear.  Function remains impaired as she does not trust herself to not get irritable around other individuals.    Discussed options for optimization including increasing the Seroquel dose to 500 mg or adding an additional medication.  Discussed pros and cons and patient elected to increase the Seroquel with a follow-up interval for two weeks.  Will try to have a more objective look at irritability at that point through mood charting.  Also, challenged her to look at typical irritable times to see if she notices a decrease.

## 2011-01-19 NOTE — Progress Notes (Signed)
Patient presents for follow-up.  She is interested in continued medication management to capture symptoms that remain after THC cessation and two+ months of treatment with Seroquel.  Currently taking 400 mg.  Denies bothersome side effects but does report sleeping until noon some days after getting up and taking her daughter to school.  Thinks part of going back to bed is a coping strategy as well.

## 2011-01-31 ENCOUNTER — Ambulatory Visit (INDEPENDENT_AMBULATORY_CARE_PROVIDER_SITE_OTHER): Payer: Medicaid Other | Admitting: Psychology

## 2011-01-31 ENCOUNTER — Encounter: Payer: Self-pay | Admitting: Psychology

## 2011-01-31 DIAGNOSIS — F39 Unspecified mood [affective] disorder: Secondary | ICD-10-CM

## 2011-01-31 NOTE — Assessment & Plan Note (Signed)
Report of mood remains irritable.  Affect is within normal limits today.  She looks brighter and better groomed than her initial visit.  She continues to go to school, denies use of marijuana and continues to exercise.  Drawing still proves too frustrating.    See patient instructions for the plan.

## 2011-01-31 NOTE — Patient Instructions (Signed)
Please schedule a follow-up for Beh Med on November 12th at 3:00.  Your [facility] appointment will be December 5th at 10:00.  I will email Dr. Kathrynn Running to see if there are changes in your medication we can make before the [facility] appointment. Please continue with the mood charting.  It may be helpful for you to reflect back on your day to see what triggers were present and how you handled them. You agreed to look at one specific behavior - hitting.  You agreed that it is not helpful and feels bad.  Thus far, your irritability is such that there is little time in between your feeling and your reaction.  Being accountable, by taking responsibility for your actions may help you the next time.  Saying something like, "Hitting is wrong.  I am sorry I hurt you.  I am working on changing the behavior.  What I should have done is..." Please tell Jess Barters to continue his rating just the same as it was this week so we can track your progress.

## 2011-01-31 NOTE — Progress Notes (Signed)
Brandi Sanders brings in her Mood Charting as requested.  She and her fiance each filled one out.  Her fiance had objective criteria for his ratings (if Brandi Sanders did X it was a 3).  He rated five high irritability days in the eleven days he evaluated.  There was only one low irritability days and none without any.  Brandi Sanders noted less "3's" and four days where she noted a "1."  Her overall assessment is that she is not where she needs to be to be functional.  Additionally, she has not noticed a significant difference since increasing to 500 mg of Seroquel.  No problem tolerating the medicine in terms of daytime somnolence and eating.    Discussed hitting as a manifestation of her anger.  Reviewed exercise, school, thc use and drawing.  Brandi Sanders requested a letter written to [employer] in relation to keeping her Medicaid benefits.

## 2011-02-14 ENCOUNTER — Ambulatory Visit (INDEPENDENT_AMBULATORY_CARE_PROVIDER_SITE_OTHER): Payer: Medicaid Other | Admitting: Psychology

## 2011-02-14 ENCOUNTER — Ambulatory Visit (INDEPENDENT_AMBULATORY_CARE_PROVIDER_SITE_OTHER): Payer: Medicaid Other | Admitting: Family Medicine

## 2011-02-14 ENCOUNTER — Encounter: Payer: Self-pay | Admitting: Family Medicine

## 2011-02-14 DIAGNOSIS — R102 Pelvic and perineal pain: Secondary | ICD-10-CM

## 2011-02-14 DIAGNOSIS — F39 Unspecified mood [affective] disorder: Secondary | ICD-10-CM

## 2011-02-14 DIAGNOSIS — N949 Unspecified condition associated with female genital organs and menstrual cycle: Secondary | ICD-10-CM

## 2011-02-14 DIAGNOSIS — F329 Major depressive disorder, single episode, unspecified: Secondary | ICD-10-CM

## 2011-02-14 DIAGNOSIS — F3289 Other specified depressive episodes: Secondary | ICD-10-CM

## 2011-02-14 MED ORDER — DIVALPROEX SODIUM ER 250 MG PO TB24: ORAL_TABLET | ORAL | Status: DC

## 2011-02-14 NOTE — Assessment & Plan Note (Signed)
Improved on Seroquel.  Continue current dosage.

## 2011-02-14 NOTE — Assessment & Plan Note (Signed)
Report of mood is irritable.  Affect is within normal limits.  She does not talk a lot during our sessions making therapy (the cognitive variety) challenging.  She seems more invested in behavioral strategies.  Explored diaphragmatic breathing.  She was able to shift from rapid, quite shallow breathing to slower, diaphragmatic breathing.  Unfortunately, she did not notice any change in how she felt.  Also tried some PMR but this too did not yield any change in how she felt.  Assessed her interest level in pursuing this strategy and she thought it was not a good match.   May work more on the cognitive level in terms of challenging thought patterns.  We spend some time talking about her inability to control her anger (we explored the difference between anger and wanting to gain power and control; it can be a fine line but she clearly thinks hers is anger based).  Might work on using the prompt that she can control it.  Very specific cognitive-behavioral strategies that are practical in nature will likely be the best match for her.  She plans to follow up with Dr. Lula Olszewski in two weeks.  I don't have anything next week and will see her in [facility] 12/5.  We will schedule a follow-up for Beh med then.

## 2011-02-14 NOTE — Progress Notes (Signed)
Trinitey presents for follow-up about 20 minutes late secondary to an earlier appointment with Dr. Lula Olszewski.  Bular says that she will be started on Depakote by Dr. Lula Olszewski, per Dr. Broadus John recommendation (see last beh-med note for details).    She brings mood charting again today - two weeks worth.  Irritability is at about the same level it was previously with at least four days of high irritability and no days of none.  Most frequent is moderate.  According to her fiance's mood chart, Kadie had four days in the last two weeks where she was so irritable she got physical.  Looked at other strategies she could attempt to utilize.

## 2011-02-14 NOTE — Assessment & Plan Note (Signed)
Pt has no red flag symptoms.  Due to time constraints (Pt has therapy session with Dr. [name] after this appointment), will defer pelvic to next visit, Well Woman exam in two weeks.

## 2011-02-14 NOTE — Patient Instructions (Signed)
I plan to see you on December 5th in [facility].  We can talk more about medications then.  If you have any issues with the Depakote, please call me at [phone].   We talked about relaxation training as another strategy to manage irritability.  We practiced belly breathing.  It does not sound like a great match for you.  Exercise - especially the kind that gets your rate heart up, works kind of the same so I would encourage to keep working at that. Please keep up with the mood charting so we can track whether the Depakote is effective.  I gave you two new ones today.

## 2011-02-14 NOTE — Assessment & Plan Note (Signed)
Rx Depakote to manage irritability symptoms.  Pt scheduled for follow up in 2 weeks.

## 2011-02-14 NOTE — Progress Notes (Deleted)
   [facility] Follow-up Outpatient Visit  Brandi Sanders 1985-11-30  Date:    Subjective: ***  There were no vitals filed for this visit.  Mental Status Examination  Appearance: *** Alert: {BHH YES OR NO:22294} Attention: {Desc; good/fair/poor:18582} Cooperative: {BHH YES OR NO:22294} Eye Contact: {BHH EYE CONTACT:22684} Speech: *** Psychomotor Activity: {Psychomotor (PAA):22696} Memory/Concentration: *** Oriented: {orientation:30299} Mood: {BHH MOOD:22306}*** Affect: {Affect (PAA):22687} Thought Processes and Associations: {Thought Process (PAA):22688} Fund of Knowledge: {BHH JUDGMENT:22312} Thought Content: {CHL AMB BH Thought Content:21022752} Insight: {BHH JUDGMENT:22312} Judgement: {BHH JUDGMENT:22312}  Diagnosis: ***  Treatment Plan: [name], [name]

## 2011-02-14 NOTE — Progress Notes (Signed)
  Subjective:    Patient ID: Brandi Sanders, female    DOB: [birth year], 25 y.o.   MRN: [id number]  HPI  Brandi Sanders comes in for follow up of her depression and mood disorder.  She says that the depression part of her symptoms has defiantly improved.  She says she no longer wakes up crying, she no longer has trouble getting out of bed in the morning.   Her irritability has not improved much.  She says she is still very irritable and angry.  She says her feelings still scare her a little bit.  We discussed Depakote, which Drs [name] and Kathrynn Running would like her to start, and its side effects.    Brandi Sanders says that lately she has been having some lower pelvic pain from time to time, as well as some pain with intercourse.  She is wondering if her Mirena has moved.  She denies any fevers/chills/vaginal discharge/change in menstrual periods.   Review of Systems Pertinent items noted in HPI.     Objective:   Physical Exam  BP 136/87  Pulse 109  Temp(Src) 98 F (36.7 C) (Oral)  Ht 5' 4.5" (1.638 m)  Wt 183 lb 3.2 oz (83.099 kg)  BMI 30.96 kg/m2 General appearance: alert, cooperative and no distress Eyes: EOMIT Throat: lips, mucosa, and tongue normal; teeth and gums normal Lungs: clear to auscultation bilaterally Heart: regular rate and rhythm, S1, S2 normal, no murmur, click, rub or gallop Extremities: extremities normal, atraumatic, no cyanosis or edema Pulses: 2+ and symmetric Skin: Skin color, texture, turgor normal. No rashes or lesions Neurologic: Grossly normal      Assessment & Plan:

## 2011-02-28 ENCOUNTER — Ambulatory Visit: Payer: Medicaid Other | Admitting: Family Medicine

## 2011-03-02 ENCOUNTER — Encounter: Payer: Medicaid Other | Admitting: Family Medicine

## 2011-03-09 ENCOUNTER — Ambulatory Visit (INDEPENDENT_AMBULATORY_CARE_PROVIDER_SITE_OTHER): Payer: Medicaid Other | Admitting: Psychology

## 2011-03-09 DIAGNOSIS — F39 Unspecified mood [affective] disorder: Secondary | ICD-10-CM

## 2011-03-09 MED ORDER — DIVALPROEX SODIUM ER 500 MG PO TB24: ORAL_TABLET | ORAL | Status: DC

## 2011-03-09 NOTE — Assessment & Plan Note (Addendum)
Mood is reported as irritable.  Affect is the same as typical for her - a little restricted.  Dr. Kathrynn Running recommended increasing the dose to 1000 mg as she is currently under dosed at 750 mg.  Will get a level after she is on the medicine for one week.    Will follow in Beh med and consult with Dr. Kathrynn Running as needed.

## 2011-03-09 NOTE — Patient Instructions (Addendum)
Please schedule a follow up for:  December 13th at 3:00.  This is to see Dr. [name].  If you can bring mood charting with you - that would be great. Dr. Kathrynn Running recommended that you increase your Depakote dose to 1000 mg a day.  We will get a blood level after you are on this medication a week.  I put an order in for this so you just need to call to schedule a lab appointment.

## 2011-03-09 NOTE — Progress Notes (Signed)
Addended by: [place], Allyn Bertoni on: 03/09/2011 10:42 AM   Modules accepted: Orders

## 2011-03-09 NOTE — Progress Notes (Signed)
Brandi Sanders presents for follow-up for mood issues.  She has been taking 750 mg of Depakote and has not noticed any significant change in her irritability.  Continues to deny significant sadness.  Irritability is present nearly all of the time at a mild to moderate level with increases to severe frequently.  No trouble taking the medicine.  Has stopped doing on-line classes for school secondary to difficulty with focus / concentration.  Continues to be afraid of how she might respond to people if they "say the wrong thing."

## 2011-03-15 ENCOUNTER — Other Ambulatory Visit: Payer: Medicaid Other

## 2011-03-15 ENCOUNTER — Encounter: Payer: Self-pay | Admitting: Family Medicine

## 2011-03-15 ENCOUNTER — Other Ambulatory Visit (HOSPITAL_COMMUNITY)
Admission: RE | Admit: 2011-03-15 | Discharge: 2011-03-15 | Disposition: A | Discharge status: Home / Self Care | Payer: Medicaid Other | Source: Ambulatory Visit | Attending: Family Medicine | Admitting: Family Medicine

## 2011-03-15 ENCOUNTER — Ambulatory Visit (INDEPENDENT_AMBULATORY_CARE_PROVIDER_SITE_OTHER): Payer: Medicaid Other | Admitting: Family Medicine

## 2011-03-15 VITALS — BP 135/88 | HR 117 | Temp 98.6°F | Ht 64.5 in | Wt 188.6 lb

## 2011-03-15 DIAGNOSIS — Z01419 Encounter for gynecological examination (general) (routine) without abnormal findings: Secondary | ICD-10-CM | POA: Insufficient documentation

## 2011-03-15 DIAGNOSIS — Z124 Encounter for screening for malignant neoplasm of cervix: Secondary | ICD-10-CM

## 2011-03-15 DIAGNOSIS — Z23 Encounter for immunization: Secondary | ICD-10-CM

## 2011-03-15 DIAGNOSIS — F39 Unspecified mood [affective] disorder: Secondary | ICD-10-CM

## 2011-03-15 NOTE — Progress Notes (Signed)
Patient ID: Brandi Sanders, female   DOB: [birth year], 25 y.o.   MRN: [id number] Subjective:     Brandi Sanders is a 25 y.o. female here for a routine exam.  Current complaints: None   General Medical History Patient is currently undergoing treatment for depression and mood disorder.  She has had some improvement with her depression but is still struggling with irritability that significantly interferes with her life.  She is smoking about 1/2 ppd, but has quit smoking Marijuana.   Gynecologic History No LMP recorded. Patient is not currently having periods (Reason: IUD). Contraception: IUD Last Pap: 2009. Results were: normal Last mammogram: n/a.  The following portions of the patient's history were reviewed and updated as appropriate: allergies, current medications, past family history, past medical history, past social history, past surgical history and problem list.  Review of Systems Pertinent items are noted in HPI.    Objective:    BP 135/88  Pulse 117  Temp(Src) 98.6 F (37 C) (Oral)  Ht 5' 4.5" (1.638 m)  Wt 188 lb 9.6 oz (85.548 kg)  BMI 31.87 kg/m2 General appearance: alert, cooperative and no distress Eyes: PERRL, EOMIT Throat: lips, mucosa, and tongue normal; teeth and gums normal Neck: no adenopathy, supple, symmetrical, trachea midline and thyroid not enlarged, symmetric, no tenderness/mass/nodules Lungs: clear to auscultation bilaterally Breasts: normal appearance, no masses or tenderness Heart: regular rate and rhythm, S1, S2 normal, no murmur, click, rub or gallop Abdomen: soft, non-tender; bowel sounds normal; no masses,  no organomegaly Pelvic: cervix normal in appearance, external genitalia normal, no adnexal masses or tenderness, no cervical motion tenderness, rectovaginal septum normal, uterus normal size, shape, and consistency and vagina normal without discharge    Assessment:    Healthy female exam.    Plan:  Pap done today.  Contraception:  IUD. Follow up in: 1 year.  For well woman exam. Follow up in 1-2 months as needed/requested by Dr. [name].  Pt to have depakote level drawn today.

## 2011-03-15 NOTE — Patient Instructions (Signed)
It was good to see you.  Your physical exam is normal today.  I will send you a letter with your pap results.    Please keep your appointment with Dr. [name] on Thursday, she will be in contact with me about your medications.

## 2011-03-17 ENCOUNTER — Ambulatory Visit (INDEPENDENT_AMBULATORY_CARE_PROVIDER_SITE_OTHER): Payer: Medicaid Other | Admitting: Psychology

## 2011-03-17 DIAGNOSIS — F39 Unspecified mood [affective] disorder: Secondary | ICD-10-CM

## 2011-03-17 NOTE — Progress Notes (Signed)
Brandi Sanders followed up since increasing Depakote to 1000 mg about a week ago.  Level was drawn and she is in the therapeutic range at 80.4.  Unfortunately, she has not noticed a difference in her level of irritability.  She has noticed decreased headaches from about 3-4 a week to about one a week.  On the downside, she is sleeping about 14 hours a day.  Goes to bed around 11:00.  Gets up around 6:00 a.m. to get daughter off to school.  Back to bed around 7:00 until noon or 1:00.  Regularly takes a two-hour nap later in the day.  Always has the television on.  From a functional standpoint, she does the following:  Dishes, gets daughter ready for school and gets her there; sweeps a little; grocery shops and laundry.  She has decorated for the holidays and is shopping a little.  She is neither depressed nor excited / energized by the holidays.

## 2011-03-17 NOTE — Assessment & Plan Note (Addendum)
Report of mood remains irritable.  She continues to deny significant feelings of sadness and does not report periods of happiness or satisfaction except in response to something (e.g. her daughter's report card).  These are quite short-lived feelings.  Irritability remains the biggest barrier to increased function with the more recent issue of over-sleeping.  Discussed possible behavioral strategies.  She agreed to experiment with turning off the television for one hour a day to see if that might increase her motivation to do something else.  Explored other strategies from bath taking to writing to praying.  She reports she has tried EVERYTHING she thinks would be useful and it has not had an impact.  Unsure what else to offer her from a therapy standpoint as cognitive strategies have not had much traction.    Will discuss with Dr. Kathrynn Running to determine next best step pharmacologically.  Next [facility] availability is January 16th - scheduled for 10:30.  Will follow in beh med clinic on December 27th at 4:00.  Addendum:  Discussed with Dr. Kathrynn Running who recommended decreasing Seroquel dose from 400 to 300 mg.

## 2011-03-18 ENCOUNTER — Telehealth: Payer: Self-pay | Admitting: Psychology

## 2011-03-18 NOTE — Telephone Encounter (Signed)
Patient called back.  She voiced an understanding of the change in treatment plan.  She has Seroquel tabs both 100 mg and 200 mg that should last her for a while.  Will follow on the 27th.

## 2011-03-18 NOTE — Telephone Encounter (Signed)
Left VM with Dr. Broadus John recommendation that Brandi Sanders decrease Seroquel dose to 300 mg.  She currently takes 400 mg.  I am not sure she has 300 mg tabs left at home.  Asked her to let me or Dr. Lula Olszewski know if she does not.

## 2011-03-23 ENCOUNTER — Encounter: Payer: Self-pay | Admitting: Family Medicine

## 2011-04-20 ENCOUNTER — Telehealth: Payer: Self-pay | Admitting: Psychology

## 2011-04-20 ENCOUNTER — Ambulatory Visit: Payer: Medicaid Other | Admitting: Psychology

## 2011-04-20 DIAGNOSIS — F39 Unspecified mood [affective] disorder: Secondary | ICD-10-CM

## 2011-04-20 NOTE — Telephone Encounter (Signed)
Brandi Sanders called to report she couldn't get a ride to her [facility] follow-up today.  She continues to take 1000 mg of Depakote and 300 mg of Seroquel without any change in irritability which is the target symptom.  She did report in the last two weeks she has had more difficulty sleep, decreased appetite and some depression.  She described the depression as noticeable but "not much" but typically she does not report any depressed mood.  Functionally, she is very limited.  The main thing she does is take her daughter to and from school.    Discussed with treatment team and Dr. Kathrynn Running made some recommendations.  Brandi Sanders has been attending appointments regularly and as far as we can tell, adherent to her medication regimen.

## 2011-04-20 NOTE — Assessment & Plan Note (Signed)
Dr. Kathrynn Running recommended decreasing Depakote to 500 mg and increasing Seroquel to 600 mg.  Cautioned about somnolence and oversleeping.  Also discussed appetite issues.  Teach back inquiry indicated Brandi Sanders understood the changes in the plan.  She said she has enough Seroquel to last until her next appointment.  Scheduled for February 6th at 9:30.

## 2011-05-11 ENCOUNTER — Ambulatory Visit (INDEPENDENT_AMBULATORY_CARE_PROVIDER_SITE_OTHER): Payer: Medicaid Other | Admitting: Psychology

## 2011-05-11 DIAGNOSIS — F32A Depression, unspecified: Secondary | ICD-10-CM

## 2011-05-11 DIAGNOSIS — F329 Major depressive disorder, single episode, unspecified: Secondary | ICD-10-CM

## 2011-05-11 DIAGNOSIS — F39 Unspecified mood [affective] disorder: Secondary | ICD-10-CM

## 2011-05-11 MED ORDER — QUETIAPINE FUMARATE 400 MG PO TABS: ORAL_TABLET | ORAL | Status: DC

## 2011-05-11 MED ORDER — LAMOTRIGINE 25 MG PO TABS: 25.0000 mg | ORAL_TABLET | Freq: Every day | ORAL | Status: DC

## 2011-05-11 NOTE — Assessment & Plan Note (Addendum)
Report of mood is irritable with more pronounced depression this office visit.  Thoughts are clear and goal directed.  She looks better than her initial presentation in terms of affect.  Discussed options.  Dr. Kathrynn Running recommended she increase the Depakote back to 1000 mg in order to decrease headaches.  Nykole reported 500 mg of Seroquel has been the dose that best balances tolerability with efficacy so she will decrease her Seroquel to 500 mg.  Dr. Kathrynn Running recommended adding Lamictal to try to manage the depressive symptoms better.  AE for Lamictal discussed.  See patient instructions for further plan.    Follow up scheduled for two weeks.  She may call to cancel if feeling well and doing well.  Will follow in one month then (per her request).

## 2011-05-11 NOTE — Progress Notes (Signed)
Brandi Sanders presents for follow-up.  She is taking 600 mg of Seroquel and 500 mg of Depakote.  These changes were made in consultation with Dr. Kathrynn Running over the phone secondary to [facility] not having transportation for her last scheduled [facility].  She notes a return of a daily headache on the decreased dose of Depakote and a jittery, "nervous" feeling on 600 mg of Seroquel.  Discussed.  Overall, mood issues remain with her report of irritability being strong.  Upon further questioning, Brandi Sanders revealed a significant lack of motivation and a general feeling that life is colorless / uninteresting.  When pushed to identify what her purpose is in life or what brings her meaning, she noted her children.  If she were feeling better, she would like to read books to her children (something she does not enjoy doing but wishes she would do more of), volunteer at her daughter's school.  She identified swimming as an activity that she enjoys very much and helps relax her.

## 2011-05-11 NOTE — Patient Instructions (Signed)
Good to see you today Latiesha. You said that the Depakote helped your headaches when you were taking 1000 mg.  Dr. Kathrynn Running recommended you go back to this dose which is two of your pills per day. You said the Seroquel dose was best at 500 mg.  It was the best in terms of mood management without making you feel jumpy or nervous.  Please decrease your dose to 500 mg. Dr. Kathrynn Running recommended starting another medicine today called Lamictal in hopes of helping with some depression / motivation symptoms you have been having.  It could be related to your irritability and thus far, we have not been too successful getting this managed with your other medications. You should take a 1/2 of your Lamictal tablet every day.  If you get an unexplained rash, you need to stop the medicine and let us know ([phone]).  This rash would probably go along with symptoms of not feeling well (like a mild flu).   You named swimming as an activity that you really enjoyed and that brings you a sense of calm.  [employer] has a pool and has a Engineer, water.  I gave you information on that today.

## 2011-05-11 NOTE — Assessment & Plan Note (Deleted)
fad

## 2011-05-19 ENCOUNTER — Telehealth: Payer: Self-pay | Admitting: Psychology

## 2011-05-19 NOTE — Telephone Encounter (Signed)
Discussed with Dr. Kathrynn Running who said that stopping the Lamictal (which Marvelous has already done) was the right thing to do.  We will follow her in [facility] on 2/20 and can reassess then.  Called her with this plan.  She voiced and understanding and agreement.

## 2011-05-19 NOTE — Telephone Encounter (Signed)
Brandi Sanders left VM yesterday.  Called her back this morning.  She stopped her Lamictal because she thinks it is making her sick.  She is reporting fever (based on feeling warm), swollen lymph nodes, throwing up, runny nose.  No rash noted.  Denies other symptoms like coughing, headache, myalgias, sore throat or excessive fatigue.  She associates her symptoms with taking Lamictal.  She started 25 mg and the next day had these symptoms.  No sick contacts.  Will send to Dr. Kathrynn Running for recommendation.

## 2011-05-25 ENCOUNTER — Ambulatory Visit (INDEPENDENT_AMBULATORY_CARE_PROVIDER_SITE_OTHER): Payer: Medicaid Other | Admitting: Psychology

## 2011-05-25 DIAGNOSIS — F39 Unspecified mood [affective] disorder: Secondary | ICD-10-CM

## 2011-05-25 NOTE — Progress Notes (Signed)
Michaeleen presents for follow-up after d/cing Lamictal secondary to getting ill shortly after taking it.  Her reported illness including fever, swollen lymph nodes, nausea and vomiting.  Denied rash.  Discussed.   No change in mood or sleep schedule.  No change in function.

## 2011-05-25 NOTE — Patient Instructions (Addendum)
Please make an appointment with Dr. Lula Olszewski in 3-4 weeks to assess how well you are tolerating the Lamictal and if it has been beneficial.   We can follow [facility] in [facility] on April 3rd at 11:30. We do not think the Lamictal was the cause of your illness.  Restarting will help determine if that was the case.  If you feel ill again, please call me at [phone].

## 2011-05-25 NOTE — Assessment & Plan Note (Addendum)
Report of mood is unchanged.  Affect is consistent for her.  She is "tired" today but does not look any more flat than her typical presentation.  No evidence of SI / HI.  Dr. Kathrynn Running does not think the medication is the cause of her illness.  She had only taken two pills at the time she got sick.  She is interested in restarting.  Reviewed reasons for recommending Lamictal.  Voiced an understanding.    Could not get her back into [facility] until April.  Agreed to follow up with Dr. Lula Olszewski in between.  See patient instructions for further information.

## 2011-06-08 ENCOUNTER — Ambulatory Visit: Payer: Medicaid Other | Admitting: Psychology

## 2011-06-23 ENCOUNTER — Encounter: Payer: Self-pay | Admitting: Family Medicine

## 2011-06-23 ENCOUNTER — Ambulatory Visit (INDEPENDENT_AMBULATORY_CARE_PROVIDER_SITE_OTHER): Payer: Medicaid Other | Admitting: Family Medicine

## 2011-06-23 VITALS — BP 127/79 | HR 105 | Temp 98.8°F | Ht 64.5 in | Wt 187.0 lb

## 2011-06-23 DIAGNOSIS — Z716 Tobacco abuse counseling: Secondary | ICD-10-CM | POA: Insufficient documentation

## 2011-06-23 DIAGNOSIS — F39 Unspecified mood [affective] disorder: Secondary | ICD-10-CM

## 2011-06-23 DIAGNOSIS — J301 Allergic rhinitis due to pollen: Secondary | ICD-10-CM

## 2011-06-23 DIAGNOSIS — Z720 Tobacco use: Secondary | ICD-10-CM

## 2011-06-23 DIAGNOSIS — E669 Obesity, unspecified: Secondary | ICD-10-CM | POA: Insufficient documentation

## 2011-06-23 DIAGNOSIS — F17200 Nicotine dependence, unspecified, uncomplicated: Secondary | ICD-10-CM

## 2011-06-23 NOTE — Assessment & Plan Note (Signed)
Not having symptoms currently, but prepared for hay fever season.

## 2011-06-23 NOTE — Assessment & Plan Note (Signed)
Patient has decreased amount of smoking, but not ready to quit at this time.  Will continue to counsel.

## 2011-06-23 NOTE — Patient Instructions (Signed)
Goals for improving mood, exercise and play time with kids:  - take kids to park to play twice a week  - play x-box active with kids three times a week.

## 2011-06-23 NOTE — Assessment & Plan Note (Signed)
Weight gain over past year, BMI now >30.  Likely at least partly related to seroquel, but complicated by depression and isolation.  She is agreeable to setting exercise goals (see pt instructions).

## 2011-06-23 NOTE — Progress Notes (Signed)
  Subjective:    Patient ID: Brandi Sanders, female    DOB: 07/23/1985, 26 y.o.   MRN: [id number]  HPI  [name] comes in for follow up.  She recently started taking lamictal for her mood disorder.  She started taking it, and felt sick, so she stopped taking it.  She was seen in mood disorder clinic, and Dr. [name] and Dr. Kathrynn Running felt like the onset of her symptoms were too soon to starting the medication, so she re-started it.  She has been taking it about 3 weeks now, and says her mood, especially irritability, is a little better. She is trying to interact with her children more.  She also feels like her relationship with [name] (fiance) is slightly better.  She has not yet been interacting socially with other adults.   Overweight- she has gained about 10 lbs since starting seroquel, but the medication has made a significant improvement in her depressive symptoms.  She says she sometimes exercises with the x-box active, which she enjoys.  She also thinks she could take her children to the park to play since the weather is getting better.   Seasonal allergies- She has both claritin and fluticasone at home- but has not had to take them yet this spring.   Tobacco use- smoking 5-7 cigarettes a day.  Is comfortable with this for now.  She says quitting is about a 5 on a scale of 1-10 in importance, but says with her mood, she does not feel like it is a top priority, but knows it is bad for her health.   Review of Systems     Objective:   Physical Exam BP 127/79  Pulse 105  Temp(Src) 98.8 F (37.1 C) (Oral)  Ht 5' 4.5" (1.638 m)  Wt 187 lb (84.823 kg)  BMI 31.60 kg/m2 General appearance: alert, cooperative and no distress Eyes: conjunctivae/corneas clear. PERRL, EOM's intact. Fundi benign. Nose: Nares normal. Septum midline. Mucosa normal. No drainage or sinus tenderness. Throat: lips, mucosa, and tongue normal; teeth and gums normal Lungs: clear to auscultation bilaterally Heart: regular  rate and rhythm, S1, S2 normal, no murmur, click, rub or gallop Extremities: extremities normal, atraumatic, no cyanosis or edema Pulses: 2+ and symmetric Psych: affect is flat but improved from prior, judgement, thought content, insight appear normal.  Does not appear to be responding to external stimuli.        Assessment & Plan:

## 2011-06-23 NOTE — Assessment & Plan Note (Signed)
Slow, but definite improvement in mood.  She is also able to set goals (see patient instructions) that will help hopefully both with mind and body health.  Tolerating Lamictal well.  Continue Lamictal and Seroquel. Patient scheduled for mood disorder clinic in April.

## 2011-07-06 ENCOUNTER — Ambulatory Visit (INDEPENDENT_AMBULATORY_CARE_PROVIDER_SITE_OTHER): Payer: Medicaid Other | Admitting: Psychology

## 2011-07-06 DIAGNOSIS — F39 Unspecified mood [affective] disorder: Secondary | ICD-10-CM
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
## 2011-07-06 NOTE — Assessment & Plan Note (Signed)
Patient has not noticed any change in mood.  No adverse side effects reported.  Tolerating the medicine well.  Recommended she increase to two pills (50 mg) now as she has been on 25 mg for two weeks or more.  Will follow her on April 17th at 10:30.  Asked her to call me back when she gets back home if she thinks of anything else.

## 2011-07-20 ENCOUNTER — Ambulatory Visit (INDEPENDENT_AMBULATORY_CARE_PROVIDER_SITE_OTHER): Payer: Medicaid Other | Admitting: Psychology

## 2011-07-20 DIAGNOSIS — F39 Unspecified mood [affective] disorder: Secondary | ICD-10-CM

## 2011-07-20 DIAGNOSIS — F329 Major depressive disorder, single episode, unspecified: Secondary | ICD-10-CM

## 2011-07-20 MED ORDER — LAMOTRIGINE 100 MG PO TABS: 100.0000 mg | ORAL_TABLET | Freq: Every day | ORAL | Status: DC

## 2011-07-20 MED ORDER — QUETIAPINE FUMARATE 100 MG PO TABS: 100.0000 mg | ORAL_TABLET | Freq: Every day | ORAL | Status: DC

## 2011-07-20 NOTE — Patient Instructions (Signed)
Please schedule a follow-up for:  May 8th at 11:30. We have increased your Lamictal dose to 100 mg.  We think (hope) that we have seen some initial improvement at your current dose and are hoping to see you more motivated in the coming weeks. Please experiment with doing more:  Park with [place], [facility], anything that used to bring you pleasure.  You have been still for a long time and it will take effort to get you moving again.

## 2011-07-20 NOTE — Assessment & Plan Note (Addendum)
Did not report on mood specifically but she said it was unchanged from last visit.  History of irritable and depressed mood.  Affect today is noticeably brighter.  She looks different and smiles easily and frequently.  It is a striking shift from her normal presentation.  She reports not noticing this.  When asked if the reading with her daughter might be indication that her mood is a little better, she agreed that it could be.  Focused on the idea that she has been uninvolved / non-participating for such a long time (secondary to her mood) that she might have to experiment with things in order to test her mood.  In other words - how would she know if things are any better unless she is trying something that might test her mood.  She had difficulty identifying things to try and we dislike providing suggestions.  However, I did remind her that volunteering at her daughter's school was something she had identified early on as a marker of improvement.  She had also mentioned swimming at one time but [facility]   I thought Dr. Kathrynn Running explained it well when he said that the medication might open some doors for [facility] but she is going to have to walk through them.  This will take effort.    Increase Lamictal to 100 mg which is target dose since she is on Depakote.    See patient instructions for follow-up info.

## 2011-07-20 NOTE — Progress Notes (Signed)
  Subjective:    Patient ID: Brandi Sanders, female    DOB: 02/12/1986, 25 y.o.   MRN: [id number]  HPI    Review of Systems     Objective:   Physical Exam        Assessment & Plan:   

## 2011-07-20 NOTE — Progress Notes (Signed)
Cigi presents for follow-up.  She takes 1000 mg of Depakote, 500 mg of Seroquel and has been on 50 mg of Lamictal for over a week and a half.  She notes no unpleasant side effects and does not note any improvement.  She does report that she has been reading to her daughter at night without irritability.  She notes the last time she got irritable to the point of getting physical was about a week ago when she got frustrated with her fiance.

## 2011-08-10 ENCOUNTER — Ambulatory Visit (INDEPENDENT_AMBULATORY_CARE_PROVIDER_SITE_OTHER): Payer: Medicaid Other | Admitting: Psychology

## 2011-08-10 DIAGNOSIS — F39 Unspecified mood [affective] disorder: Secondary | ICD-10-CM

## 2011-08-10 NOTE — Assessment & Plan Note (Addendum)
Report of mood is euthymic today. Affect is consistent.  Thoughts are clear.  Report of mood and function appears moderately to markedly different than initial presentation.  She lacks insight into this but when asked specifically, she recognizes the shift and seems pleased.  We recommended she continue to experiment with doing more.  We recommend maintaining the current pharmacologic treatment and she is in agreement with that. Will follow in 2 months or as needed.

## 2011-08-10 NOTE — Patient Instructions (Signed)
Please schedule a follow up for July 3rd at 11:00. Great job with engaging in activities with family and trying new things.  We hope you continue to do this. Call [phone] with any changes, questions or concerns.

## 2011-08-10 NOTE — Progress Notes (Signed)
Brandi Sanders presents for follow-up.  She has no agenda items.  She is taking 100 mg of Lamictal, 1000 mg of Depakote (mostly for migraine prophylaxis) and 500 mg of Seroquel.  She does not note improvement in mood and says that others have not noted any.  When asked specifically about depressed mood or irritability, she reports about 6 days in the last two weeks where she had about a 2-3 hour period of irritability on those days.  They were not consecutive days necessarily.  She went to her room to get away from others.  She has not been physically aggressive in these past two weeks.  When asked if this is a change from what she remembers several months ago, she realizes that the number of irritable episodes has decreased and the severity of these episodes have decreased - per her report.  She also notes a change in function that includes taking her kids to the park, doing the Wii with them and last night she attended a concert at her daughter's school.

## 2011-08-17 ENCOUNTER — Other Ambulatory Visit: Payer: Self-pay | Admitting: Family Medicine

## 2011-08-17 MED ORDER — FLUTICASONE FUROATE 27.5 MCG/SPRAY NA SUSP: 2.0000 | Freq: Every day | NASAL | Status: DC

## 2011-10-05 ENCOUNTER — Ambulatory Visit (INDEPENDENT_AMBULATORY_CARE_PROVIDER_SITE_OTHER): Payer: Medicaid Other | Admitting: Psychology

## 2011-10-05 DIAGNOSIS — F39 Unspecified mood [affective] disorder: Secondary | ICD-10-CM

## 2011-10-05 MED ORDER — DIVALPROEX SODIUM ER 500 MG PO TB24: ORAL_TABLET | ORAL | Status: DC

## 2011-10-05 NOTE — Progress Notes (Signed)
Brandi Sanders had a follow-up scheduled but she is here with a sick kid and has missed the time for her appointment.  I checked in with her briefly.  She needs a refill on her Depakote.  She reports she is doing well.  Function remains higher (as it was reported last visit).  She has a return of some headache but manageable.  She has noted some mildly depressed periods but again, manageable.  Irritability remains improved.

## 2011-10-05 NOTE — Assessment & Plan Note (Addendum)
Will refill Depakote.  Unfortunately can't be seen today but based on quick interview, patient's mood and function appear to remain at an improved (but probably not ideal) level.  Face looks bright again today.  Raseel recognizes this as well.  Will need to check in on headaches and mood soon.  Asked for her to follow-up on July 24th at 11:00.

## 2011-10-05 NOTE — Patient Instructions (Addendum)
Sorry we didn't get a chance to see you today. Please schedule a follow-up for July 24th at 11:00. Dr. Kathrynn Running refilled your Depakote.

## 2011-10-05 NOTE — Progress Notes (Signed)
  Subjective:    Patient ID: Brandi Sanders, female    DOB: February 06, 1986, 26 y.o.   MRN: [id number]  HPI    Review of Systems     Objective:   Physical Exam        Assessment & Plan:

## 2011-10-21 ENCOUNTER — Encounter: Payer: Self-pay | Admitting: Family Medicine

## 2011-10-21 ENCOUNTER — Ambulatory Visit (INDEPENDENT_AMBULATORY_CARE_PROVIDER_SITE_OTHER): Payer: Medicaid Other | Admitting: Family Medicine

## 2011-10-21 VITALS — BP 151/109 | HR 111 | Temp 98.6°F | Ht 63.0 in | Wt 172.0 lb

## 2011-10-21 DIAGNOSIS — K0500 Acute gingivitis, plaque induced: Secondary | ICD-10-CM | POA: Insufficient documentation

## 2011-10-21 DIAGNOSIS — F39 Unspecified mood [affective] disorder: Secondary | ICD-10-CM

## 2011-10-21 LAB — COMPREHENSIVE METABOLIC PANEL
AST: 22 U/L (ref 0–37)
Alkaline Phosphatase: 45 U/L (ref 39–117)
BUN: 8 mg/dL (ref 6–23)
Creat: 0.72 mg/dL (ref 0.50–1.10)
Glucose, Bld: 80 mg/dL (ref 70–99)

## 2011-10-21 LAB — CBC
HCT: 41.7 % (ref 36.0–46.0)
MCH: 32 pg (ref 26.0–34.0)
MCHC: 35.3 g/dL (ref 30.0–36.0)
MCV: 90.7 fL (ref 78.0–100.0)
RDW: 14.1 % (ref 11.5–15.5)

## 2011-10-21 MED ORDER — LIDOCAINE VISCOUS 2 % MT SOLN: 20.0000 mL | OROMUCOSAL | Status: AC | PRN

## 2011-10-21 MED ORDER — ACETAMINOPHEN-CODEINE #3 300-30 MG PO TABS: 1.0000 | ORAL_TABLET | ORAL | Status: AC | PRN

## 2011-10-21 NOTE — Progress Notes (Signed)
  Subjective:    Patient ID: Brandi Sanders, female    DOB: 1986/02/22, 26 y.o.   MRN: [id number]  HPI Patient seen as SDA patient.  She complains of pain along right side of gums (4 top and 3 lower molars) for the past 3 days.  No fevers or chills. Saw her dentist yesterday, was prescribed amoxil and an oral rinse.  She reports that her mouth hurts more than it did before.  Painful to chew.  No pain to swallow, no pain on left side of mouth.  No sore throat.   Seen at mood disorders clinic.  Was started on Lamictal in April.  She denies rashes or bleeding from gums; no oral lesions per se.  Denies SI; says she has been doing well from a depression standpoint.  She denies alcohol or drug use now or in the past; is cutting down on cigarettes, from 1/2ppd down to 5 cigs/day now.    Review of Systems See above    Objective:   Physical Exam Appears uncomfortable but not in acute distress. HEENT Hyperpigmented gingiva but without erythema or ulcers/aphthae.  Clear oropharynx. No cervical adenopathy. Several dental fillings, but no visible broken teeth or erosions/caries. Moist mucus membranes. TMs clear bilaterally. No frontal or maxillary tenderness with palpation.  SKIN No desquamation or other lesions on lips, nor on skin of palms, extremities, or trunk/back.        Assessment & Plan:

## 2011-10-21 NOTE — Assessment & Plan Note (Signed)
Patient with gingival pain;  She just started amoxil from her dentist.  No oral lesions to suggest SJS.  I am prescribing her lidocaine swish/swallow for pain control.  May use T#3 as needed.  It is recommended that she see her dentist early in the week to address the gingival pain.  I do not suspect it is coming from other sources (ears, TMJ, or sinuses).

## 2011-10-21 NOTE — Assessment & Plan Note (Signed)
Patient reports no current or past history of prescription pain med/narcotic abuse.  She does admit to [facility] use.  I am prescribing her a limited amt of T#3 for her gingival pain until she can see her dentist.  She has been on Lamictal since April; I am checking transaminases and a CBC, as well as a depakote level (lamictal can alter depakote levels).  She is scheduled to follow up with [facility] next week.

## 2011-10-21 NOTE — Patient Instructions (Addendum)
It was a pleasure to see you today.  I believe your gum pain is related to a dental issue, not a medical one.  For this reason, I would like you to see your dentist at the beginning of next week.   I am checking bloodwork today as a monitor of tests related to the Lamictal that you started in April.  Please continue to take your lamictal and Depakote as you have been taking them.   Keep appointments with Dr [name] and Dr Kathrynn Running for July 24th.

## 2011-10-24 ENCOUNTER — Encounter: Payer: Self-pay | Admitting: Family Medicine

## 2011-10-24 ENCOUNTER — Telehealth: Payer: Self-pay | Admitting: Family Medicine

## 2011-10-24 NOTE — Telephone Encounter (Signed)
Called number for cell phone in chart, to give lab results.  No answer, left voice message for patient to call for results.  Paula Compton, MD

## 2011-10-26 ENCOUNTER — Ambulatory Visit: Payer: Medicaid Other | Admitting: Psychology

## 2011-10-26 ENCOUNTER — Telehealth: Payer: Self-pay | Admitting: Psychology

## 2011-10-26 NOTE — Telephone Encounter (Signed)
Brandi Sanders called at 11:20 to state that she overslept and will miss her 11:00 appointment.  She said she is on some medication for mouth pain and it knocked her out.  Rescheduled for 8/14 at 10:00.  She says she has enough medication to last until the.

## 2011-11-16 ENCOUNTER — Ambulatory Visit: Payer: Medicaid Other | Admitting: Psychology

## 2012-03-20 ENCOUNTER — Encounter: Payer: Medicaid Other | Admitting: Family Medicine

## 2012-03-24 ENCOUNTER — Encounter (HOSPITAL_COMMUNITY): Payer: Self-pay | Admitting: Family Medicine

## 2012-03-24 ENCOUNTER — Emergency Department (HOSPITAL_COMMUNITY)
Admission: EM | Admit: 2012-03-24 | Discharge: 2012-03-24 | Disposition: A | Discharge status: Home / Self Care | Payer: Medicaid Other | Attending: Emergency Medicine | Admitting: Emergency Medicine

## 2012-03-24 ENCOUNTER — Emergency Department (HOSPITAL_COMMUNITY): Payer: Medicaid Other

## 2012-03-24 DIAGNOSIS — R509 Fever, unspecified: Secondary | ICD-10-CM | POA: Insufficient documentation

## 2012-03-24 DIAGNOSIS — J069 Acute upper respiratory infection, unspecified: Secondary | ICD-10-CM | POA: Insufficient documentation

## 2012-03-24 DIAGNOSIS — R6889 Other general symptoms and signs: Secondary | ICD-10-CM | POA: Insufficient documentation

## 2012-03-24 DIAGNOSIS — F17200 Nicotine dependence, unspecified, uncomplicated: Secondary | ICD-10-CM | POA: Insufficient documentation

## 2012-03-24 DIAGNOSIS — J029 Acute pharyngitis, unspecified: Secondary | ICD-10-CM | POA: Insufficient documentation

## 2012-03-24 LAB — COMPREHENSIVE METABOLIC PANEL
ALT: 8 U/L (ref 0–35)
AST: 24 U/L (ref 0–37)
CO2: 23 mEq/L (ref 19–32)
Calcium: 9.2 mg/dL (ref 8.4–10.5)
Chloride: 100 mEq/L (ref 96–112)
GFR calc Af Amer: >90 mL/min (ref >90)
GFR calc non Af Amer: >90 mL/min (ref >90)
Glucose, Bld: 87 mg/dL (ref 70–99)
Sodium: 137 mEq/L (ref 135–145)
Total Bilirubin: 0.2 mg/dL — ABNORMAL LOW (ref 0.3–1.2)

## 2012-03-24 LAB — CBC WITH DIFFERENTIAL/PLATELET
Eosinophils Relative: 1 % (ref 0–5)
HCT: 40.1 % (ref 36.0–46.0)
Lymphocytes Relative: 31 % (ref 12–46)
Lymphs Abs: 1.3 10*3/uL (ref 0.7–4.0)
MCV: 90.1 fL (ref 78.0–100.0)
Monocytes Absolute: 0.6 10*3/uL (ref 0.1–1.0)
Neutro Abs: 2.3 10*3/uL (ref 1.7–7.7)
Platelets: 161 10*3/uL (ref 150–400)
RBC: 4.45 MIL/uL (ref 3.87–5.11)
WBC: 4.3 10*3/uL (ref 4.0–10.5)

## 2012-03-24 MED ORDER — HYDROCODONE-ACETAMINOPHEN 7.5-500 MG/15ML PO SOLN
10.0000 mL | Freq: Once | ORAL | Status: AC
  Administered 2012-03-24: 10 mL via ORAL
  Filled 2012-03-24: qty 15

## 2012-03-24 MED ORDER — HYDROCODONE-ACETAMINOPHEN 7.5-500 MG/15ML PO SOLN: 15.0000 mL | Freq: Four times a day (QID) | ORAL | Status: DC | PRN

## 2012-03-24 MED ORDER — POTASSIUM CHLORIDE CRYS ER 20 MEQ PO TBCR
40.0000 meq | EXTENDED_RELEASE_TABLET | Freq: Once | ORAL | Status: AC
  Administered 2012-03-24: 40 meq via ORAL
  Filled 2012-03-24: qty 2

## 2012-03-24 MED ORDER — GUAIFENESIN 100 MG/5ML PO LIQD: 100.0000 mg | ORAL | Status: DC | PRN

## 2012-03-24 NOTE — ED Provider Notes (Signed)
History     CSN: [id number]  Arrival date & time 03/24/12  1836   First MD Initiated Contact with Patient 03/24/12 2023      Chief Complaint  Patient presents with  . Cough    (Consider location/radiation/quality/duration/timing/severity/associated sxs/prior. treatment) HPI  26 year old female presents complaining of cough. Patient reports for the past 3 days she has gradual onset of fever, chills sneezing, runny nose, sore throat, cough productive with yellow sputum, pleuritic chest and posttussive emesis. Symptom has been persistence, moderate in severity, mildly improved with taking Tylenol and  ibuprofen at home. She denies headache, mild assist, shortness of breath, abdominal pain, dysuria, or diarrhea. She denies any rash. Her son is having the same symptoms and was diagnosed with having upper respiratory infection. Patient is a smoker.  Past Medical History  Diagnosis Date  . Abdominal pain     History reviewed. No pertinent past surgical history.  History reviewed. No pertinent family history.  History  Substance Use Topics  . Smoking status: Current Some Day Smoker -- 0.3 packs/day    Types: Cigarettes  . Smokeless tobacco: Not on file  . Alcohol Use: No    OB History    Grav Para Term Preterm Abortions TAB SAB Ect Mult Living                  Review of Systems  Constitutional: Positive for fever and chills.  HENT: Positive for sore throat and sneezing.   Respiratory: Positive for cough.   Skin: Negative for rash.    Allergies  Review of patient's allergies indicates no known allergies.  Home Medications   Current Outpatient Rx  Name  Route  Sig  Dispense  Refill  . IBUPROFEN 200 MG PO TABS   Oral   Take 400 mg by mouth every 8 (eight) hours as needed. For pain         . LEVONORGESTREL 20 MCG/24HR IU IUD   Intrauterine   1 each by Intrauterine route once. Placed 06-04-08            BP 126/74  Pulse 101  Temp 99.3 F (37.4 C)  Resp 18   SpO2 96%  Physical Exam  Nursing note and vitals reviewed. Constitutional: She is oriented to person, place, and time. She appears well-developed and well-nourished. No distress.       Awake, alert, nontoxic appearance  HENT:  Head: Atraumatic.  Right Ear: External ear normal.  Left Ear: External ear normal.  Nose: Nose normal.  Mouth/Throat: Oropharynx is clear and moist.  Eyes: Conjunctivae normal are normal. Right eye exhibits no discharge. Left eye exhibits no discharge.  Neck: Neck supple.  Cardiovascular: Normal rate and regular rhythm.   Pulmonary/Chest: Effort normal. No respiratory distress. She has no wheezes. She has no rales. She exhibits no tenderness.  Abdominal: Soft. There is no tenderness. There is no rebound.  Musculoskeletal: She exhibits no edema and no tenderness.       ROM appears intact, no obvious focal weakness  Neurological: She is alert and oriented to person, place, and time.       Mental status and motor strength appears intact  Skin: No rash noted.  Psychiatric: She has a normal mood and affect.    ED Course  Procedures (including critical care time)  Labs Reviewed  CBC WITH DIFFERENTIAL - Abnormal; Notable for the following:    Monocytes Relative 14 (*)     All other components within normal limits  COMPREHENSIVE METABOLIC PANEL - Abnormal; Notable for the following:    Potassium 3.2 (*)     BUN 4 (*)     Total Bilirubin 0.2 (*)     All other components within normal limits   Dg Chest 2 View  03/24/2012  *RADIOLOGY REPORT*  Clinical Data: Fever, cough  CHEST - 2 VIEW  Comparison: 04/05/2007  Findings: Cardiomediastinal silhouette is within normal limits. The lungs are clear. No pleural effusion.  No pneumothorax.  No acute osseous abnormality.  IMPRESSION: Normal chest.   Original Report Authenticated By: Christiana Pellant, M.D.      No diagnosis found.  1. URI  MDM  Patient presents with URI symptoms. CXR shows no evidence of pna.  K+ 3.2.   Supplementation given.  Lortab for cough.  Care instruction given.    BP 130/89  Pulse 98  Temp 99.3 F (37.4 C)  Resp 18  SpO2 100%  I have reviewed nursing notes and vital signs. I personally reviewed the imaging tests through PACS system  I reviewed available ER/hospitalization records thought the EMR         Fayrene Helper, [place] 03/24/12 2112

## 2012-03-24 NOTE — ED Notes (Addendum)
Per pt she has had a productive cough x 3 days and vomiting. Denies diarrhea. Denies abdominal pain

## 2012-03-25 NOTE — ED Provider Notes (Signed)
Medical screening examination/treatment/procedure(s) were performed by non-physician practitioner and as supervising physician I was immediately available for consultation/collaboration.   Carleene Cooper III, MD 03/25/12 [phone]

## 2012-06-19 ENCOUNTER — Encounter: Payer: Medicaid Other | Admitting: Family Medicine

## 2012-06-26 ENCOUNTER — Encounter: Payer: Self-pay | Admitting: Family Medicine

## 2012-06-26 ENCOUNTER — Ambulatory Visit (INDEPENDENT_AMBULATORY_CARE_PROVIDER_SITE_OTHER): Payer: Medicaid Other | Admitting: Family Medicine

## 2012-06-26 VITALS — BP 126/78 | HR 88 | Temp 98.6°F | Ht 63.0 in | Wt 174.0 lb

## 2012-06-26 DIAGNOSIS — F17200 Nicotine dependence, unspecified, uncomplicated: Secondary | ICD-10-CM

## 2012-06-26 DIAGNOSIS — G43909 Migraine, unspecified, not intractable, without status migrainosus: Secondary | ICD-10-CM

## 2012-06-26 DIAGNOSIS — Z0000 Encounter for general adult medical examination without abnormal findings: Secondary | ICD-10-CM

## 2012-06-26 DIAGNOSIS — Z720 Tobacco use: Secondary | ICD-10-CM

## 2012-06-26 DIAGNOSIS — F39 Unspecified mood [affective] disorder: Secondary | ICD-10-CM

## 2012-06-26 MED ORDER — PROPRANOLOL HCL ER 60 MG PO CP24: 60.0000 mg | ORAL_CAPSULE | Freq: Every day | ORAL | Status: DC

## 2012-06-26 NOTE — Progress Notes (Signed)
The patient was counseled on the dangers of tobacco use, and was advised to quit.  Reviewed strategies to maximize success, including removing cigarettes and smoking materials from environment, substitution of other forms of reinforcement, support of family/friends and local smoking cessation programs (1800quitnow)

## 2012-06-26 NOTE — Patient Instructions (Signed)
It was good to see you.   You are not due for your pap until 2013.  You also need to have your Mirena removed (and replaced if you want) in March of 2013.    Your headaches might be Migraine headaches.  I want you to try Propranolol to prevent the headache- this is a medication you have to take every day for it to work.  Please come back in about one month if your headaches are not getting better.   I am glad you have cut back on your smoking.  Our health coach will be contacting you to discuss quitting.  Please let us know if we can help you reach your goals.

## 2012-06-26 NOTE — Assessment & Plan Note (Signed)
Significantly improved and off medications- will monitor for now.

## 2012-06-26 NOTE — Assessment & Plan Note (Signed)
Assessment: pt smokes 1/2 pk day. Husband smokes in home as well.  Plan:  Pt will reduce from 10 to 5 cigarettes today 06/26/12.  Pt will talk to husband about cutting back.  Pt came up with plan to eat ice, play phone games, play xbox games, play with children when she has urge to smoke.   Marland Kitchen

## 2012-06-26 NOTE — Progress Notes (Signed)
Patient ID: Brandi Sanders, female   DOB: Jan 04, 1986, 27 y.o.   MRN: [id number]  Subjective:    Patient ID: Brandi Sanders, female    DOB: 07/20/1985, 27 y.o.   MRN: [id number]  HPI:  Brandi Sanders comes in for her annual physical.  She is doing relatively well.  She had her last pap in 2012.  She says when she was 22 she had an abnormal one and had to have her cervix frozen, but they have been normal since then.   Headaches- she says she has been having daily headaches.  She says when she was on Depakote it helped a little but then stopped working.  She says she has sensitivity to light and noise.  She has occassionally taken ibuprofen for headaches but that does not help much so she does not take them often. She denies vision changes.   Depression/Bipolar depression: no longer taking seroquel or depakote.  She says she is feeling very well mood wise.  Denies anger, depressed mood, no longer has fear of interacting with others.  She has gotten a job and is doing well.   Tobacco abuse: has cut back, smoking 1/2 ppd or less.  Is interested in quitting.   Past Medical History  Diagnosis Date  . Abdominal pain   . Depression    History  Substance Use Topics  . Smoking status: Current Some Day Smoker -- 0.30 packs/day    Types: Cigarettes  . Smokeless tobacco: Never Used  . Alcohol Use: No   ROS Pertinent items in HPI    Objective:  Physical Exam:  BP 126/78  Pulse 88  Temp(Src) 98.6 F (37 C) (Oral)  Ht 5\' 3"  (1.6 m)  Wt 174 lb (78.926 kg)  BMI 30.83 kg/m2 General appearance: alert, cooperative and no distress Head: Normocephalic, without obvious abnormality, atraumatic Lungs: clear to auscultation bilaterally Heart: regular rate and rhythm, S1, S2 normal, no murmur, click, rub or gallop Pulses: 2+ and symmetric     Assessment & Plan:

## 2012-06-26 NOTE — Assessment & Plan Note (Signed)
Suspect headaches are migraines and will Rx propranolol for prophylaxis.  F/U in one month if not improving.

## 2012-07-26 ENCOUNTER — Other Ambulatory Visit: Payer: Self-pay | Admitting: Family Medicine

## 2012-07-26 MED ORDER — LORATADINE 10 MG PO TABS: 10.0000 mg | ORAL_TABLET | Freq: Every day | ORAL | Status: DC

## 2012-12-27 ENCOUNTER — Ambulatory Visit: Payer: Medicaid Other | Admitting: Family Medicine

## 2013-01-21 ENCOUNTER — Encounter: Payer: Self-pay | Admitting: Family Medicine

## 2013-01-21 ENCOUNTER — Ambulatory Visit (INDEPENDENT_AMBULATORY_CARE_PROVIDER_SITE_OTHER): Payer: Medicaid Other | Admitting: Family Medicine

## 2013-01-21 VITALS — BP 157/104 | HR 105 | Temp 99.3°F | Wt 171.0 lb

## 2013-01-21 DIAGNOSIS — I10 Essential (primary) hypertension: Secondary | ICD-10-CM | POA: Insufficient documentation

## 2013-01-21 DIAGNOSIS — Z30432 Encounter for removal of intrauterine contraceptive device: Secondary | ICD-10-CM

## 2013-01-21 NOTE — Assessment & Plan Note (Signed)
Unable to preform in office today.  After discussion of risks and benefits, pt. Opts for removal in OR.--will schedule.

## 2013-01-21 NOTE — Progress Notes (Signed)
  Subjective:    Patient ID: Brandi Sanders, female    DOB: Feb 12, 1986, 27 y.o.   MRN: [id number]  HPI  Here today for IUD removal.  Desires pregnancy.  IUD placed FM resident.  Came back for string trimming.  Review of Systems  Constitutional: Negative for fever and chills.  Respiratory: Negative for shortness of breath.   Cardiovascular: Negative for chest pain.  Gastrointestinal: Negative for nausea, vomiting and abdominal pain.       Objective:   Physical Exam  Constitutional: She is oriented to person, place, and time. She appears well-developed and well-nourished.  HENT:  Head: Normocephalic and atraumatic.  Eyes: No scleral icterus.  Neck: Neck supple.  Cardiovascular: Normal rate.   Pulmonary/Chest: Effort normal. She has no rales.  Abdominal: Soft. There is no tenderness.  Genitourinary: Vagina normal.  Musculoskeletal: Normal range of motion.  Neurological: She is alert and oriented to person, place, and time.     Procedure: Speculum placed inside vagina.  Cervix visualized. No strings visualized.  Kelly clamp passed into cervix and attempted to blindly grab strings failed.  Tresa Endo passed into uterus.  IUD could not be felt.  Attempt at blind removal attempted x 5-6 tries until pt. Could no longer tolerate procedure.  No IUD hook in office.       Assessment & Plan:

## 2013-01-21 NOTE — Patient Instructions (Addendum)
Preparing for Pregnancy Preparing for pregnancy (preconceptual care) by getting counseling and information from your caregiver before getting pregnant is a good idea. It will help you and your baby have a better chance to have a healthy, safe pregnancy and delivery of your baby. Make an appointment with your caregiver to talk about your health, medical, and family history and how to prepare yourself before getting pregnant. Your caregiver will do a complete physical exam and a Pap test. They will want to know:  About you, your spouse or partner, and your family's medical and genetic history.  If you are eating a balanced diet and drinking enough fluids.  What vitamins and mineral supplements you are taking. This includes taking folic acid before getting pregnant to help prevent birth defects.  What medications you are taking including prescription, over-the-counter and herbal medications.  If there is any substance abuse like alcohol, smoking, and illegal drugs.  If there is any mental or physical domestic violence.  If there is any risk of sexually transmitted disease between you and your partner.  What immunizations and vaccinations you have had and what you may need before getting pregnant.  If you should get tested for HIV infection.  If there is any exposure to chemical or toxic substances at home or work.  If there are medical problems you have that need to be treated and kept under control before getting pregnant such as diabetes, high blood pressure or others.  If there were any past surgeries, pregnancies and problems with them.  What your current weight is and to set a goal as to how much weight you should gain while pregnant. Also, they will check if you should lose or gain weight before getting pregnant.  What is your exercise routine and what it is safe when you are pregnant.  If there are any physical disabilities that need to be addressed.  About spacing your  pregnancies when there are other children.  If there is a financial problem that may affect you having a child. After talking about the above points with your caregiver, your caregiver will give you advice on how to help treat and work with you on solving any issues, if necessary, before getting pregnant. The goal is to have a healthy and safe pregnancy for you and your baby. You should keep an accurate record of your menstrual periods because it will help in determining your due date. Immunizations that you should have before getting pregnant:   Regular measles, [place] measles (rubella) and mumps.  Tetanus and diphtheria.  Chickenpox, if not immune.  Herpes zoster (Varicella) if not immune.  Human papilloma virus vaccine (HPV) between the age of 62 and 65 years old.  Hepatitis A vaccine.  Hepatitis B vaccine.  Influenza vaccine.  Pneumococcal vaccine (pneumonia). You should avoid getting pregnant for one month after getting vaccinated with a live virus vaccine such as [place] measles (rubella) vaccine. Other immunizations may be necessary depending on where you live, such as malaria. Ask your caregiver if any other immunizations are needed for you. HOME CARE INSTRUCTIONS   Follow the advice of your caregiver.  Before getting pregnant:  Begin taking vitamins, supplements, and 0.4 milligrams folic acid daily.  Get your immunizations up-to-date.  Get help from a nutrition counselor if you do not understand what a balanced diet is, need help with a special medical diet or if you need help to lose or gain weight.  Begin exercising.  Stop smoking, taking illegal drugs,  and drinking alcoholic beverages.  Get counseling if there is and type of domestic violence.  Get checked for sexually transmitted diseases including HIV.  Get any medical problems under control (diabetes, high blood pressure, convulsions, asthma or others).  Resolve any financial concerns.  Be sure you and  your spouse or partner are ready to have a baby.  Keep an accurate record of your menstrual periods. Document Released: 03/03/2008 Document Revised: 06/13/2011 Document Reviewed: 03/03/2008 [facility] Patient Information 2014 [place], [place]. Hypertension As your heart beats, it forces blood through your arteries. This force is your blood pressure. If the pressure is too high, it is called hypertension (HTN) or high blood pressure. HTN is dangerous because you may have it and not know it. High blood pressure may mean that your heart has to work harder to pump blood. Your arteries may be narrow or stiff. The extra work puts you at risk for heart disease, stroke, and other problems.  Blood pressure consists of two numbers, a higher number over a lower, 110/72, for example. It is stated as "110 over 72." The ideal is below 120 for the top number (systolic) and under 80 for the bottom (diastolic). Write down your blood pressure today. You should pay close attention to your blood pressure if you have certain conditions such as:  Heart failure.  Prior heart attack.  Diabetes  Chronic kidney disease.  Prior stroke.  Multiple risk factors for heart disease. To see if you have HTN, your blood pressure should be measured while you are seated with your arm held at the level of the heart. It should be measured at least twice. A one-time elevated blood pressure reading (especially in the Emergency Department) does not mean that you need treatment. There may be conditions in which the blood pressure is different between your right and left arms. It is important to see your caregiver soon for a recheck. Most people have essential hypertension which means that there is not a specific cause. This type of high blood pressure may be lowered by changing lifestyle factors such as:  Stress.  Smoking.  Lack of exercise.  Excessive weight.  Drug/tobacco/alcohol use.  Eating less salt. Most people do not  have symptoms from high blood pressure until it has caused damage to the body. Effective treatment can often prevent, delay or reduce that damage. TREATMENT  When a cause has been identified, treatment for high blood pressure is directed at the cause. There are a large number of medications to treat HTN. These fall into several categories, and your caregiver will help you select the medicines that are best for you. Medications may have side effects. You should review side effects with your caregiver. If your blood pressure stays high after you have made lifestyle changes or started on medicines,   Your medication(s) may need to be changed.  Other problems may need to be addressed.  Be certain you understand your prescriptions, and know how and when to take your medicine.  Be sure to follow up with your caregiver within the time frame advised (usually within two weeks) to have your blood pressure rechecked and to review your medications.  If you are taking more than one medicine to lower your blood pressure, make sure you know how and at what times they should be taken. Taking two medicines at the same time can result in blood pressure that is too low. SEEK IMMEDIATE MEDICAL CARE IF:  You develop a severe headache, blurred or changing vision,  or confusion.  You have unusual weakness or numbness, or a faint feeling.  You have severe chest or abdominal pain, vomiting, or breathing problems. MAKE SURE YOU:   Understand these instructions.  Will watch your condition.  Will get help right away if you are not doing well or get worse. Document Released: 03/21/2005 Document Revised: 06/13/2011 Document Reviewed: 11/09/2007 [facility] Patient Information 2014 [place], [place].

## 2013-01-21 NOTE — Assessment & Plan Note (Signed)
BP up on 2 separate occasions.  Likely CHTN.  Needs some treatment, compatible with pregnancy-could start with HCTZ and switch to labetalol during pregnancy. To f/u with PCP.

## 2013-01-22 ENCOUNTER — Encounter (HOSPITAL_COMMUNITY): Payer: Self-pay | Admitting: Pharmacist

## 2013-01-23 ENCOUNTER — Encounter (HOSPITAL_COMMUNITY): Payer: Self-pay | Admitting: *Deleted

## 2013-02-04 ENCOUNTER — Ambulatory Visit (HOSPITAL_COMMUNITY)
Admission: RE | Admit: 2013-02-04 | Discharge: 2013-02-04 | Disposition: A | Discharge status: Home / Self Care | Payer: Medicaid Other | Source: Ambulatory Visit | Attending: Family Medicine | Admitting: Family Medicine

## 2013-02-04 ENCOUNTER — Encounter (HOSPITAL_COMMUNITY)
Admission: RE | Disposition: A | Discharge status: Home / Self Care | Payer: Self-pay | Source: Ambulatory Visit | Attending: Family Medicine

## 2013-02-04 ENCOUNTER — Encounter (HOSPITAL_COMMUNITY): Payer: Medicaid Other | Admitting: Anesthesiology

## 2013-02-04 ENCOUNTER — Encounter (HOSPITAL_COMMUNITY): Payer: Self-pay | Admitting: Anesthesiology

## 2013-02-04 ENCOUNTER — Ambulatory Visit (HOSPITAL_COMMUNITY): Payer: Medicaid Other | Admitting: Anesthesiology

## 2013-02-04 DIAGNOSIS — Z1889 Other specified retained foreign body fragments: Secondary | ICD-10-CM

## 2013-02-04 DIAGNOSIS — T193XXA Foreign body in uterus, initial encounter: Secondary | ICD-10-CM

## 2013-02-04 DIAGNOSIS — Z30432 Encounter for removal of intrauterine contraceptive device: Secondary | ICD-10-CM

## 2013-02-04 HISTORY — PX: DILATION AND CURETTAGE OF UTERUS: SHX78

## 2013-02-04 HISTORY — DX: Other specified health status: Z78.9

## 2013-02-04 LAB — CBC
Hemoglobin: 15 g/dL (ref 12.0–15.0)
MCHC: 36 g/dL (ref 30.0–36.0)

## 2013-02-04 LAB — PREGNANCY, URINE: Preg Test, Ur: NEGATIVE

## 2013-02-04 SURGERY — DILATION AND CURETTAGE
Anesthesia: Monitor Anesthesia Care | Site: Vagina | Wound class: Clean Contaminated

## 2013-02-04 MED ORDER — CEFAZOLIN SODIUM-DEXTROSE 2-3 GM-% IV SOLR
INTRAVENOUS | Status: DC | PRN
  Administered 2013-02-04: 2 g via INTRAVENOUS

## 2013-02-04 MED ORDER — CEFAZOLIN SODIUM-DEXTROSE 2-3 GM-% IV SOLR
INTRAVENOUS | Status: AC
  Filled 2013-02-04: qty 50

## 2013-02-04 MED ORDER — PROPOFOL 10 MG/ML IV EMUL
INTRAVENOUS | Status: AC
  Filled 2013-02-04: qty 20

## 2013-02-04 MED ORDER — PROPOFOL 10 MG/ML IV EMUL
INTRAVENOUS | Status: DC | PRN
  Administered 2013-02-04: 50 mg via INTRAVENOUS
  Administered 2013-02-04 (×3): 30 mg via INTRAVENOUS
  Administered 2013-02-04: 50 mg via INTRAVENOUS

## 2013-02-04 MED ORDER — IBUPROFEN 600 MG PO TABS: 600.0000 mg | ORAL_TABLET | Freq: Four times a day (QID) | ORAL | Status: DC | PRN

## 2013-02-04 MED ORDER — KETOROLAC TROMETHAMINE 30 MG/ML IJ SOLN
INTRAMUSCULAR | Status: AC
  Filled 2013-02-04: qty 1

## 2013-02-04 MED ORDER — LACTATED RINGERS IV SOLN: INTRAVENOUS | Status: DC

## 2013-02-04 MED ORDER — METOCLOPRAMIDE HCL 5 MG/ML IJ SOLN: 10.0000 mg | Freq: Once | INTRAMUSCULAR | Status: DC | PRN

## 2013-02-04 MED ORDER — KETOROLAC TROMETHAMINE 30 MG/ML IJ SOLN
INTRAMUSCULAR | Status: DC | PRN
  Administered 2013-02-04: 30 mg via INTRAVENOUS

## 2013-02-04 MED ORDER — LIDOCAINE-EPINEPHRINE 1 %-1:100000 IJ SOLN
INTRAMUSCULAR | Status: AC
  Filled 2013-02-04: qty 1

## 2013-02-04 MED ORDER — FENTANYL CITRATE 0.05 MG/ML IJ SOLN
25.0000 ug | INTRAMUSCULAR | Status: DC | PRN
  Administered 2013-02-04: 50 ug via INTRAVENOUS

## 2013-02-04 MED ORDER — LIDOCAINE-EPINEPHRINE 1 %-1:100000 IJ SOLN
INTRAMUSCULAR | Status: DC | PRN
  Administered 2013-02-04: 10 mL

## 2013-02-04 MED ORDER — LIDOCAINE HCL (CARDIAC) 20 MG/ML IV SOLN
INTRAVENOUS | Status: AC
  Filled 2013-02-04: qty 5

## 2013-02-04 MED ORDER — LIDOCAINE HCL (CARDIAC) 20 MG/ML IV SOLN
INTRAVENOUS | Status: DC | PRN
  Administered 2013-02-04: 40 mg via INTRAVENOUS

## 2013-02-04 MED ORDER — LACTATED RINGERS IV SOLN
INTRAVENOUS | Status: DC
  Administered 2013-02-04 (×2): via INTRAVENOUS

## 2013-02-04 MED ORDER — ONDANSETRON HCL 4 MG/2ML IJ SOLN
INTRAMUSCULAR | Status: DC | PRN
  Administered 2013-02-04: 4 mg via INTRAVENOUS

## 2013-02-04 MED ORDER — FENTANYL CITRATE 0.05 MG/ML IJ SOLN
INTRAMUSCULAR | Status: AC
  Administered 2013-02-04: 50 ug via INTRAVENOUS
  Filled 2013-02-04: qty 2

## 2013-02-04 MED ORDER — MIDAZOLAM HCL 2 MG/2ML IJ SOLN
INTRAMUSCULAR | Status: AC
  Filled 2013-02-04: qty 2

## 2013-02-04 MED ORDER — FENTANYL CITRATE 0.05 MG/ML IJ SOLN
INTRAMUSCULAR | Status: DC | PRN
  Administered 2013-02-04: 100 ug via INTRAVENOUS

## 2013-02-04 MED ORDER — MIDAZOLAM HCL 2 MG/2ML IJ SOLN
INTRAMUSCULAR | Status: DC | PRN
  Administered 2013-02-04: 2 mg via INTRAVENOUS

## 2013-02-04 MED ORDER — FENTANYL CITRATE 0.05 MG/ML IJ SOLN
INTRAMUSCULAR | Status: AC
  Filled 2013-02-04: qty 5

## 2013-02-04 MED ORDER — MEPERIDINE HCL 25 MG/ML IJ SOLN: 6.2500 mg | INTRAMUSCULAR | Status: DC | PRN

## 2013-02-04 SURGICAL SUPPLY — 18 items
CATH ROBINSON RED A/P 16FR (CATHETERS) ×2 IMPLANT
CLOTH BEACON ORANGE TIMEOUT ST (SAFETY) ×2 IMPLANT
CONTAINER PREFILL 10% NBF 60ML (FORM) ×4 IMPLANT
DECANTER SPIKE VIAL GLASS SM (MISCELLANEOUS) ×2 IMPLANT
DRESSING TELFA 8X3 (GAUZE/BANDAGES/DRESSINGS) ×2 IMPLANT
GLOVE BIOGEL PI IND STRL 7.0 (GLOVE) ×1 IMPLANT
GLOVE BIOGEL PI INDICATOR 7.0 (GLOVE) ×1
GLOVE ECLIPSE 7.0 STRL STRAW (GLOVE) ×4 IMPLANT
GOWN PREVENTION PLUS XLARGE (GOWN DISPOSABLE) ×2 IMPLANT
GOWN STRL REIN XL XLG (GOWN DISPOSABLE) ×4 IMPLANT
NDL SPNL 22GX3.5 QUINCKE BK (NEEDLE) ×1 IMPLANT
NEEDLE SPNL 22GX3.5 QUINCKE BK (NEEDLE) ×2 IMPLANT
PACK VAGINAL MINOR WOMEN LF (CUSTOM PROCEDURE TRAY) ×2 IMPLANT
PAD OB MATERNITY 4.3X12.25 (PERSONAL CARE ITEMS) ×2 IMPLANT
PAD PREP 24X48 CUFFED NSTRL (MISCELLANEOUS) ×2 IMPLANT
SYR CONTROL 10ML LL (SYRINGE) ×2 IMPLANT
TOWEL OR 17X24 6PK STRL BLUE (TOWEL DISPOSABLE) ×4 IMPLANT
WATER STERILE IRR 1000ML POUR (IV SOLUTION) ×2 IMPLANT

## 2013-02-04 NOTE — Op Note (Signed)
PROCEDURE DATE: 02/04/2013  PREOPERATIVE DIAGNOSIS: Retained IUD  POSTOPERATIVE DIAGNOSIS: The same  PROCEDURE:  Dilation and Curettage  SURGEON:  Jahid Weida S  INDICATIONS:  Pt. Desires IUD removal to achieve pregnancy.  Attempt at removal in the office failed.  Risks of surgery were discussed with the patient including but not limited to: bleeding which may require transfusion; infection which may require antibiotics; injury to uterus or surrounding organs. Marland Kitchen  FINDINGS:  A 10 cm size uterus, nml appearing cervix.  ANESTHESIA:    Monitored intravenous sedation, paracervical block-Michael A. Malen Gauze, MD  ESTIMATED BLOOD LOSS:  Less than 20 ml.  SPECIMENS: None  COMPLICATIONS:  None immediate.  PROCEDURE DETAILS:  The patient received intravenous antibiotics while in the preoperative area.  She was then taken to the operating room where general anesthesia was administered and was found to be adequate. SCD's were in place. After an adequate timeout was performed, she was placed in the dorsal lithotomy position in [place] stirrups then prepped and draped in the sterile manner.   Her bladder was catheterized for an unmeasured amount of clear, yellow urine. A speculum was then placed in the patient's vagina and a single tooth tenaculum was applied to the anterior lip of the cervix.  A paracervical block using 1% Lidocaine with Epi was administered. The uterus sounded to 10 cm. The cervix was gently dilated to accommodate a polyp forcep that was gently advanced to the uterine fundus. IUD grasped and removed easily and intact. There was minimal bleeding noted and the tenaculum removed with good hemostasis noted.  The patient tolerated the procedure well.  The patient was taken to the recovery area in stable condition.  Jessaca Philippi SMD 02/04/2013 11:50 AM

## 2013-02-04 NOTE — Anesthesia Postprocedure Evaluation (Signed)
Anesthesia Post Note  Patient: Brandi Sanders  Procedure(s) Performed: Procedure(s) (LRB): DILATATION AND CURETTAGE WITH IUD REMOVAL  (N/A)  Anesthesia type: General  Patient location: PACU  Post pain: Pain level controlled  Post assessment: Post-op Vital signs reviewed  Last Vitals:  Filed Vitals:   02/04/13 1043  BP: 149/98  Pulse: 87  Temp: 36.9 C  Resp: 20    Post vital signs: Reviewed  Level of consciousness: sedated  Complications: No apparent anesthesia complications

## 2013-02-04 NOTE — H&P (Signed)
  Brandi Sanders is an 27 y.o.  female.    Chief Complaint: retained IUD  HPI: IUD in for some time.  Failed office removal.  Interested in pregnancy soon.  Past Medical History  Diagnosis Date  . Abdominal pain   . Depression   . Medical history non-contributory     Past Surgical History  Procedure Laterality Date  . Vaginal deliveries  [phone]  . Umbilical hernia repair  2009  . Cold knife coniaztion  2007    History reviewed. No pertinent family history. Social History:  reports that she has been smoking Cigarettes.  She has a 5 pack-year smoking history. She has never used smokeless tobacco. She reports that she uses illicit drugs (Marijuana) about once per week. She reports that she does not drink alcohol.  Allergies: No Known Allergies  Medications Prior to Admission  Medication Sig Dispense Refill  . levonorgestrel (MIRENA) 20 MCG/24HR IUD 1 each by Intrauterine route once. Placed 06-04-08       . loratadine (CLARITIN) 10 MG tablet Take 10 mg by mouth daily as needed.         A comprehensive review of systems was negative.  Blood pressure 149/98, pulse 87, temperature 98.4 F (36.9 C), temperature source Oral, resp. rate 20, SpO2 100.00%. BP 149/98  Pulse 87  Temp(Src) 98.4 F (36.9 C) (Oral)  Resp 20  SpO2 100% General appearance: alert, cooperative and appears stated age Neck: supple, symmetrical, trachea midline Lungs: normal effort Heart: regular rate and rhythm Abdomen: soft, non-tender; bowel sounds normal; no masses,  no organomegaly Extremities: extremities normal, atraumatic, no cyanosis or edema Skin: Skin color, texture, turgor normal. No rashes or lesions Neurologic: Grossly normal   Lab Results  Component Value Date   WBC 4.3 03/24/2012   HGB 14.3 03/24/2012   HCT 40.1 03/24/2012   MCV 90.1 03/24/2012   PLT 161 03/24/2012   Lab Results  Component Value Date   PREGTESTUR  Value: NEGATIVE        THE SENSITIVITY OF THIS METHODOLOGY IS  >24 mIU/mL 11/26/2009     Assessment/Plan Patient Active Problem List   Diagnosis Date Noted  . Essential hypertension, benign 01/21/2013  . Encounter for IUD removal 01/21/2013  . Gingivitis, acute 10/21/2011  . Tobacco abuse counseling 06/23/2011  . Obesity 06/23/2011  . Pelvic pain in female 02/14/2011  . Unspecified episodic mood disorder 11/03/2010  . Abdominal pain, periumbilical 09/23/2010  . HIDRADENITIS 03/25/2010  . ALLERGIC RHINITIS, SEASONAL 08/07/2006  . MIGRAINE, UNSPEC., W/O INTRACTABLE MIGRAINE 06/01/2006   For Dilation and Curettage and IUD removal.  Chiann Goffredo S 02/04/2013, 10:47 AM

## 2013-02-04 NOTE — Transfer of Care (Signed)
Immediate Anesthesia Transfer of Care Note  Patient: Brandi Sanders  Procedure(s) Performed: Procedure(s): DILATATION AND CURETTAGE WITH IUD REMOVAL  (N/A)  Patient Location: PACU  Anesthesia Type:MAC  Level of Consciousness: awake, alert  and oriented  Airway & Oxygen Therapy: Patient Spontanous Breathing  Post-op Assessment: Report given to PACU RN and Post -op Vital signs reviewed and stable  Post vital signs: stable  Complications: No apparent anesthesia complications

## 2013-02-04 NOTE — Anesthesia Postprocedure Evaluation (Signed)
Anesthesia Post Note  Patient: Brandi Sanders  Procedure(s) Performed: Procedure(s) (LRB): DILATATION AND CURETTAGE WITH IUD REMOVAL  (N/A)  Anesthesia type: MAC  Patient location: PACU  Post pain: Pain level controlled  Post assessment: Post-op Vital signs reviewed  Last Vitals:  Filed Vitals:   02/04/13 1043  BP: 149/98  Pulse: 87  Temp: 36.9 C  Resp: 20    Post vital signs: Reviewed  Level of consciousness: sedated  Complications: No apparent anesthesia complications

## 2013-02-04 NOTE — Anesthesia Preprocedure Evaluation (Signed)
Anesthesia Evaluation  Patient identified by MRN, date of birth, ID band Patient awake    Reviewed: Allergy & Precautions, H&P , NPO status , Patient's Chart, lab work & pertinent test results  Airway Mallampati: III TM Distance: >3 FB Neck ROM: Full    Dental no notable dental hx. (+) Teeth Intact   Pulmonary Current Smoker,  breath sounds clear to auscultation        Cardiovascular hypertension, Rhythm:Regular Rate:Normal     Neuro/Psych  Headaches, PSYCHIATRIC DISORDERS Depression    GI/Hepatic negative GI ROS, Neg liver ROS,   Endo/Other  negative endocrine ROS  Renal/GU negative Renal ROS  negative genitourinary   Musculoskeletal negative musculoskeletal ROS (+)   Abdominal   Peds  Hematology negative hematology ROS (+)   Anesthesia Other Findings   Reproductive/Obstetrics Retained IUD                           Anesthesia Physical Anesthesia Plan  ASA: II  Anesthesia Plan: MAC   Post-op Pain Management:    Induction: Intravenous  Airway Management Planned: Natural Airway and Simple Face Mask  Additional Equipment:   Intra-op Plan:   Post-operative Plan:   Informed Consent: I have reviewed the patients History and Physical, chart, labs and discussed the procedure including the risks, benefits and alternatives for the proposed anesthesia with the patient or authorized representative who has indicated his/her understanding and acceptance.   Dental advisory given  Plan Discussed with: CRNA, Anesthesiologist and Surgeon  Anesthesia Plan Comments:         Anesthesia Quick Evaluation

## 2013-02-05 ENCOUNTER — Encounter (HOSPITAL_COMMUNITY): Payer: Self-pay | Admitting: Family Medicine

## 2013-06-10 ENCOUNTER — Ambulatory Visit (INDEPENDENT_AMBULATORY_CARE_PROVIDER_SITE_OTHER): Payer: Medicaid Other | Admitting: Family Medicine

## 2013-06-10 ENCOUNTER — Encounter: Payer: Self-pay | Admitting: Family Medicine

## 2013-06-10 VITALS — BP 136/88 | HR 98 | Temp 98.7°F | Wt 169.0 lb

## 2013-06-10 DIAGNOSIS — N912 Amenorrhea, unspecified: Secondary | ICD-10-CM

## 2013-06-10 LAB — POCT URINE PREGNANCY: Preg Test, Ur: NEGATIVE

## 2013-06-10 NOTE — Patient Instructions (Signed)
Good to see you today!  Thanks for coming in.  Please stop smoking completely  Continue taking the folic acid everyday  The only otc medication that is completely safe is Tylenol  If the nausea or vomiting is not better in one week come back  If not menstrual period in the next 2-3 weeks then check a urine preg test or come in

## 2013-06-10 NOTE — Progress Notes (Signed)
   Subjective:    Patient ID: Brandi BootyMichelle N Coopersmith, female    DOB: 08-10-1985, 28 y.o.   MRN: [id number]  HPI  Vomiting Late one week from last period.  Having nausea and vomiting for the last few days.  No abdomen pain or fever or bleeding or rash.  Her child had virus a few days ago.  Is desiring to be pregnant and not using contraception.  Took urine preg test was negative at home   Smoking - a few per day.  Trying to cut down to nothing   Is taking vitamins  Review of Symptoms - see HPI  PMH - Smoking status noted.     Review of Systems     Objective:   Physical Exam  Alert no acute distress Abdomen: soft and non-tender without masses, organomegaly or hernias noted.  No guarding or rebound No CVAT Skin:  Intact without suspicious lesions or rashes       Assessment & Plan:   Vomiting - seems most consistent with mild gastroenteritis.  Possible very early pregnancy.  Suggested acting like she could be pregnant ie - stop smoking cntinue vitamins limit otc medications

## 2013-12-14 ENCOUNTER — Encounter (HOSPITAL_COMMUNITY): Payer: Self-pay | Admitting: Emergency Medicine

## 2013-12-14 ENCOUNTER — Emergency Department (HOSPITAL_COMMUNITY)
Admission: EM | Admit: 2013-12-14 | Discharge: 2013-12-14 | Disposition: A | Discharge status: Home / Self Care | Payer: Medicaid Other | Attending: Emergency Medicine | Admitting: Emergency Medicine

## 2013-12-14 DIAGNOSIS — X500XXA Overexertion from strenuous movement or load, initial encounter: Secondary | ICD-10-CM | POA: Diagnosis not present

## 2013-12-14 DIAGNOSIS — Z3202 Encounter for pregnancy test, result negative: Secondary | ICD-10-CM | POA: Insufficient documentation

## 2013-12-14 DIAGNOSIS — Y9289 Other specified places as the place of occurrence of the external cause: Secondary | ICD-10-CM | POA: Diagnosis not present

## 2013-12-14 DIAGNOSIS — S335XXA Sprain of ligaments of lumbar spine, initial encounter: Secondary | ICD-10-CM | POA: Diagnosis not present

## 2013-12-14 DIAGNOSIS — Z8659 Personal history of other mental and behavioral disorders: Secondary | ICD-10-CM | POA: Insufficient documentation

## 2013-12-14 DIAGNOSIS — Y9389 Activity, other specified: Secondary | ICD-10-CM | POA: Diagnosis not present

## 2013-12-14 DIAGNOSIS — S39012A Strain of muscle, fascia and tendon of lower back, initial encounter: Secondary | ICD-10-CM

## 2013-12-14 DIAGNOSIS — R109 Unspecified abdominal pain: Secondary | ICD-10-CM | POA: Insufficient documentation

## 2013-12-14 DIAGNOSIS — F17200 Nicotine dependence, unspecified, uncomplicated: Secondary | ICD-10-CM | POA: Insufficient documentation

## 2013-12-14 LAB — COMPREHENSIVE METABOLIC PANEL
ALT: 7 U/L (ref 0–35)
AST: 18 U/L (ref 0–37)
Albumin: 4.1 g/dL (ref 3.5–5.2)
Alkaline Phosphatase: 48 U/L (ref 39–117)
Anion gap: 12 (ref 5–15)
BILIRUBIN TOTAL: 0.4 mg/dL (ref 0.3–1.2)
BUN: 9 mg/dL (ref 6–23)
CHLORIDE: 103 meq/L (ref 96–112)
CO2: 24 mEq/L (ref 19–32)
CREATININE: 0.71 mg/dL (ref 0.50–1.10)
Calcium: 9.4 mg/dL (ref 8.4–10.5)
GFR calc Af Amer: >90 mL/min (ref >90)
Glucose, Bld: 83 mg/dL (ref 70–99)
Potassium: 4.1 mEq/L (ref 3.7–5.3)
SODIUM: 139 meq/L (ref 137–147)
Total Protein: 7.9 g/dL (ref 6.0–8.3)

## 2013-12-14 LAB — CBC WITH DIFFERENTIAL/PLATELET
Basophils Absolute: 0 10*3/uL (ref 0.0–0.1)
Basophils Relative: 1 % (ref 0–1)
EOS ABS: 0.2 10*3/uL (ref 0.0–0.7)
EOS PCT: 3 % (ref 0–5)
HCT: 40.1 % (ref 36.0–46.0)
Hemoglobin: 14 g/dL (ref 12.0–15.0)
Lymphocytes Relative: 39 % (ref 12–46)
Lymphs Abs: 2.6 10*3/uL (ref 0.7–4.0)
MCH: 31.5 pg (ref 26.0–34.0)
MCHC: 34.9 g/dL (ref 30.0–36.0)
MCV: 90.1 fL (ref 78.0–100.0)
MONOS PCT: 6 % (ref 3–12)
Monocytes Absolute: 0.4 10*3/uL (ref 0.1–1.0)
NEUTROS PCT: 51 % (ref 43–77)
Neutro Abs: 3.4 10*3/uL (ref 1.7–7.7)
PLATELETS: 185 10*3/uL (ref 150–400)
RBC: 4.45 MIL/uL (ref 3.87–5.11)
RDW: 13.3 % (ref 11.5–15.5)
WBC: 6.6 10*3/uL (ref 4.0–10.5)

## 2013-12-14 LAB — URINALYSIS, ROUTINE W REFLEX MICROSCOPIC
BILIRUBIN URINE: NEGATIVE
GLUCOSE, UA: NEGATIVE mg/dL
KETONES UR: NEGATIVE mg/dL
Nitrite: NEGATIVE
PH: 6 (ref 5.0–8.0)
Protein, ur: NEGATIVE mg/dL
Specific Gravity, Urine: 1.025 (ref 1.005–1.030)
Urobilinogen, UA: 0.2 mg/dL (ref 0.0–1.0)

## 2013-12-14 LAB — POC URINE PREG, ED: Preg Test, Ur: NEGATIVE

## 2013-12-14 LAB — URINE MICROSCOPIC-ADD ON

## 2013-12-14 LAB — LIPASE, BLOOD: LIPASE: 20 U/L (ref 11–59)

## 2013-12-14 MED ORDER — CYCLOBENZAPRINE HCL 10 MG PO TABS: 10.0000 mg | ORAL_TABLET | Freq: Two times a day (BID) | ORAL | Status: DC | PRN

## 2013-12-14 NOTE — ED Provider Notes (Signed)
CSN: [id number]     Arrival date & time 12/14/13  1446 History   First MD Initiated Contact with Patient 12/14/13 1620     Chief Complaint  Patient presents with  . Flank Pain    Patient is a 28 y.o. female presenting with flank pain and back pain. The history is provided by the patient.  Flank Pain This is a new problem. Episode onset: 2 weeks. The problem occurs constantly. The problem has been unchanged. Pertinent negatives include no abdominal pain, anorexia, change in bowel habit, chest pain, chills, coughing, fever, nausea, numbness, rash, urinary symptoms, vomiting or weakness. The symptoms are aggravated by bending and twisting (sitting up). She has tried NSAIDs for the symptoms. The treatment provided mild relief.  Back Pain Location:  Lumbar spine and thoracic spine Quality:  Aching Radiates to:  Does not radiate Pain severity:  Mild Onset quality:  Gradual Duration:  1 week Timing:  Constant Progression:  Unchanged Chronicity:  New Context: not falling, not lifting heavy objects, not MVA and not twisting   Relieved by:  Being still Worsened by:  Twisting Associated symptoms: no abdominal pain, no bladder incontinence, no bowel incontinence, no chest pain, no dysuria, no fever, no leg pain, no numbness, no paresthesias, no perianal numbness, no tingling, no weakness and no weight loss   Risk factors: no hx of cancer and not pregnant   No IVDU  Past Medical History  Diagnosis Date  . Abdominal pain   . Depression   . Medical history non-contributory    Past Surgical History  Procedure Laterality Date  . Vaginal deliveries  [phone]  . Umbilical hernia repair  2009  . Cold knife coniaztion  2007  . Dilation and curettage of uterus N/A 02/04/2013    Procedure: DILATATION AND CURETTAGE WITH IUD REMOVAL ;  Surgeon: Reva Bores, MD;  Location: [facility];  Service: Gynecology;  Laterality: N/A;   No family history on file. History  Substance Use Topics  . Smoking status:  Current Every Day Smoker -- 0.50 packs/day for 10 years    Types: Cigarettes  . Smokeless tobacco: Never Used  . Alcohol Use: No   OB History   Grav Para Term Preterm Abortions TAB SAB Ect Mult Living                 Review of Systems  Constitutional: Negative for fever, chills and weight loss.  Respiratory: Negative for cough, shortness of breath and wheezing.   Cardiovascular: Negative for chest pain.  Gastrointestinal: Negative for nausea, vomiting, abdominal pain, anorexia, change in bowel habit and bowel incontinence.  Genitourinary: Positive for flank pain (right). Negative for bladder incontinence and dysuria.  Musculoskeletal: Positive for back pain.  Skin: Negative for rash.  Neurological: Negative for tingling, weakness, numbness and paresthesias.  All other systems reviewed and are negative.    Allergies  Review of patient's allergies indicates no known allergies.  Home Medications   Prior to Admission medications   Medication Sig Start Date End Date Taking? Authorizing Provider  ibuprofen (ADVIL,MOTRIN) 600 MG tablet Take 1 tablet (600 mg total) by mouth every 6 (six) hours as needed for pain. 02/04/13   Reva Bores, MD   BP 121/84  Pulse 74  Temp(Src) 99.1 F (37.3 C) (Oral)  Resp 18  Ht  (1.626 m)  Wt 163 lb (73.936 kg)  BMI 27.97 kg/m2  SpO2 97%  LMP 12/12/2013 Physical Exam  Nursing note and vitals reviewed. Constitutional:  She is oriented to person, place, and time. She appears well-developed and well-nourished. No distress.  Winced when sitting up  HENT:  Head: Normocephalic and atraumatic.  Nose: Nose normal.  Mouth/Throat: Oropharynx is clear and moist. No oropharyngeal exudate.  Eyes: Conjunctivae are normal.  Neck: Normal range of motion. Neck supple. No tracheal deviation present.  Cardiovascular: Normal rate, regular rhythm and normal heart sounds.   No murmur heard. Pulmonary/Chest: Effort normal. No respiratory distress. She has no  wheezes. She has no rales. She exhibits no tenderness.  Abdominal: Soft. Bowel sounds are normal. She exhibits no distension and no mass. There is no tenderness. There is no rebound and no guarding.  Neg Rovsing, psoas, obturator, CVAT  Musculoskeletal: Normal range of motion. She exhibits no edema.  Back midline. + R thoracolumbar paraspinous TTP. Neg SLR  Neurological: She is alert and oriented to person, place, and time. She has normal reflexes.  Toes flexor. DTRs patellar 2+ equal. Normal sensation. Gait normal.   Skin: Skin is warm and dry. No rash noted.  Psychiatric: She has a normal mood and affect.    ED Course  Procedures (including critical care time) Labs Review Labs Reviewed  CBC WITH DIFFERENTIAL  COMPREHENSIVE METABOLIC PANEL  LIPASE, BLOOD  URINALYSIS, ROUTINE W REFLEX MICROSCOPIC  POC URINE PREG, ED    Imaging Review No results found.   EKG Interpretation None      MDM   Final diagnoses:  Lumbar strain, initial encounter    No red flags for back pain.  Appears well. Pain is reproducible with standing up and twisting and palpation. N/v intact.  No urinary symptoms, UA neg for blood, or infx markers. Not c.w kidney stone. Neg urine preg. Labs unremarkable (overtriaged and not ordered by this provider).  PERC negative. Lungs CTAB.   Pain is MSK in etiology.  No evidence of cord compression or epidural abscess. Imaging not indicated.  Pt declined analgesia.  Treat with flexeril.     Sofie Rower, MD 12/14/13 Rickey Primus

## 2013-12-14 NOTE — ED Notes (Signed)
Pt c/o right flank pain x 2 weeks. Pt denies urinary symptoms or N/V.

## 2013-12-15 NOTE — ED Provider Notes (Signed)
I saw and evaluated the patient, reviewed the resident's note and I agree with the findings and plan.   EKG Interpretation None     Patient here with 2 weeks of back pain, worse with twisting, constant, not c/w renal colic. No fevers. C/w musculoskeletal pain.  Elwin Mocha, MD 12/15/13 [phone]

## 2013-12-25 ENCOUNTER — Encounter: Payer: Self-pay | Admitting: Family Medicine

## 2013-12-25 ENCOUNTER — Ambulatory Visit (INDEPENDENT_AMBULATORY_CARE_PROVIDER_SITE_OTHER): Payer: Medicaid Other | Admitting: Family Medicine

## 2013-12-25 VITALS — BP 141/87 | HR 112 | Temp 98.7°F | Wt 165.0 lb

## 2013-12-25 DIAGNOSIS — M940 Chondrocostal junction syndrome [Tietze]: Secondary | ICD-10-CM

## 2013-12-25 MED ORDER — DICLOFENAC SODIUM 75 MG PO TBEC: 75.0000 mg | DELAYED_RELEASE_TABLET | Freq: Two times a day (BID) | ORAL | Status: DC

## 2013-12-25 NOTE — Patient Instructions (Signed)
Costochondritis Costochondritis, sometimes called Tietze syndrome, is a swelling and irritation (inflammation) of the tissue (cartilage) that connects your ribs with your breastbone (sternum). It causes pain in the chest and rib area. Costochondritis usually goes away on its own over time. It can take up to 6 weeks or longer to get better, especially if you are unable to limit your activities. CAUSES  Some cases of costochondritis have no known cause. Possible causes include:  Injury (trauma).  Exercise or activity such as lifting.  Severe coughing. SIGNS AND SYMPTOMS  Pain and tenderness in the chest and rib area.  Pain that gets worse when coughing or taking deep breaths.  Pain that gets worse with specific movements. DIAGNOSIS  Your health care provider will do a physical exam and ask about your symptoms. Chest X-rays or other tests may be done to rule out other problems. TREATMENT  Costochondritis usually goes away on its own over time. Your health care provider may prescribe medicine to help relieve pain. HOME CARE INSTRUCTIONS   Avoid exhausting physical activity. Try not to strain your ribs during normal activity. This would include any activities using chest, abdominal, and side muscles, especially if heavy weights are used.  Apply ice to the affected area for the first 2 days after the pain begins.  Put ice in a plastic bag.  Place a towel between your skin and the bag.  Leave the ice on for 20 minutes, 2-3 times a day.  Only take over-the-counter or prescription medicines as directed by your health care provider. SEEK MEDICAL CARE IF:  You have redness or swelling at the rib joints. These are signs of infection.  Your pain does not go away despite rest or medicine. SEEK IMMEDIATE MEDICAL CARE IF:   Your pain increases or you are very uncomfortable.  You have shortness of breath or difficulty breathing.  You cough up blood.  You have worse chest pains,  sweating, or vomiting.  You have a fever or persistent symptoms for more than 2-3 days.  You have a fever and your symptoms suddenly get worse. MAKE SURE YOU:   Understand these instructions.  Will watch your condition.  Will get help right away if you are not doing well or get worse. Document Released: 12/29/2004 Document Revised: 01/09/2013 Document Reviewed: 10/23/2012 Exit[employer] Patient Information 2015 [employer]. This information is not intended to replace advice given to you by your health care provider. Make sure you discuss any questions you have with your health care provider.  

## 2013-12-25 NOTE — Progress Notes (Signed)
    Subjective:    Patient ID: Brandi Sanders is a 28 y.o. female presenting with back spasm  on 12/25/2013  HPI: 3 wk h/o side and back pain on right side.  No initiating event. Seen in ED with negative urine and negative w/u.  Placed on Flexeril.  No improvement.  Seems worse in am.  No alleviating factors. Worse with lying or certain positions.  Denies dysuria or blood in urine. Denies F/C/N/V. No vaginal discharge.  Review of Systems  Constitutional: Negative for fever and chills.  Respiratory: Negative for shortness of breath.   Cardiovascular: Negative for chest pain.  Gastrointestinal: Negative for nausea, vomiting and abdominal pain.  Genitourinary: Negative for dysuria.  Skin: Negative for rash.      Objective:    BP 141/87  Pulse 112  Temp(Src) 98.7 F (37.1 C) (Oral)  Wt 165 lb (74.844 kg)  LMP 12/12/2013 Physical Exam  Constitutional: She is oriented to person, place, and time. She appears well-developed and well-nourished. No distress.  HENT:  Head: Normocephalic and atraumatic.  Eyes: No scleral icterus.  Neck: Neck supple.  Cardiovascular: Normal rate.   Pulmonary/Chest: Effort normal.  Abdominal: Soft.  Neurological: She is alert and oriented to person, place, and time.  Skin: Skin is warm and dry.  Psychiatric: She has a normal mood and affect.        Assessment & Plan:   Costochondritis, acute - Plan: diclofenac (VOLTAREN) 75 MG EC tablet   Return in about 4 weeks (around 01/22/2014).

## 2013-12-31 ENCOUNTER — Encounter: Payer: Self-pay | Admitting: Family Medicine

## 2013-12-31 ENCOUNTER — Ambulatory Visit (INDEPENDENT_AMBULATORY_CARE_PROVIDER_SITE_OTHER): Payer: Medicaid Other | Admitting: Family Medicine

## 2013-12-31 VITALS — BP 128/81 | HR 84 | Temp 98.4°F | Ht 64.0 in | Wt 163.1 lb

## 2013-12-31 DIAGNOSIS — Z309 Encounter for contraceptive management, unspecified: Secondary | ICD-10-CM | POA: Insufficient documentation

## 2013-12-31 DIAGNOSIS — IMO0002 Reserved for concepts with insufficient information to code with codable children: Secondary | ICD-10-CM

## 2013-12-31 MED ORDER — NORETHINDRONE 0.35 MG PO TABS: 1.0000 | ORAL_TABLET | Freq: Every day | ORAL | Status: DC

## 2013-12-31 NOTE — Patient Instructions (Signed)
Great to see you! Try micronor daily. Take at the same time daily. Set an alarm on your phone if this helps you remember. This will protect against pregnancy but not against STDs. Follow up with me if you have any side effects. Call and let me know how this is going in 2 months. Follow up with me at my next available when you are free to discuss pain with sex. If you have worsened headaches or signs of a clot, seek immediate care. Continue working on quitting smoking. If you need help with this, set up an appointment with pharmacy clinic.  Best,  Leona SingletonMaria T Zaydn Gutridge, MD

## 2013-12-31 NOTE — Assessment & Plan Note (Signed)
28 y.o. female with h/o migraine with aura though none in past year. Also smoker. Some evidence for increased risk of VTE in women >35 who are smokers and use OCP. Slightly less risk with progesterone-only OCP. Pt is not interested in other forms of birth control. Pregnancy also can raise risk for VTE.  - Discussed risks and benefits, and patient opts to begin OCP. - Micronor rx for 1 year. Discussed taking daily for max effetiveness. Call if any side effects. - Seek immediate care if any leg swelling, headache worsening, or other concerns. - Informed that OCP does not protect against STD. - Encouraged smoking cessation. Pharm appt if desires help. - Precepted with Dr Deirdre Priesthambliss.

## 2013-12-31 NOTE — Progress Notes (Signed)
Patient ID: Brandi Sanders, female   DOB: 1985/08/04, 28 y.o.   MRN: [id number] Subjective:   CC: Discuss birth control  HPI:   Brandi Sanders is a 28 y.o. female with h/o migraines and tobacco abuse here for birth control discussion. She is very interested in the OCP. She has not tried it before. She has tried mirena but there was difficulty removing it so she doe snot want to try that. She has also heard of a friend having problems with nexplanon. She knows she would like OCP. She denies any headache in the past year. She is trying to cut back on smoking. She Denies FH of stroke or clot or personal hx of the same. Reports she has not been sexually active since before LMP.  Review of Systems - Per HPI. Additionally, reports dyspareunia but does not want to discuss today. Denies vaginal discharge, fevers, chills, or concern for STD or pregnancy.  PMH: Reviewed. Migraine with aura,. Not in last year. LMP: 10th, regular G2P2002, healthy babies, healthy pregnancies Smoking status: Smokes 5-6 cigarettes per day    Objective:  Physical Exam BP 128/81  Pulse 84  Temp(Src) 98.4 F (36.9 C) (Oral)  Ht 5\' 4"  (1.626 m)  Wt 163 lb 1.6 oz (73.982 kg)  BMI 27.98 kg/m2  LMP 12/12/2013 GEN: NAD, pleasant CV: RRR, no m/r/g, 2+ bilateral radial pulses PULM: CTAB, normal effort EXTR: No LE edema or calf tenderness ABD: S/NT/ND    Assessment:     Brandi BootyMichelle N Fitterer is a 28 y.o. female with h/o migraine and tobacco use here for birth control.    Plan:     # See problem list and after visit summary for problem-specific plans.  # Health Maintenance: Declined flu shot.  Follow-up: Follow up PRN for side effects of micronor. F/u at next available to discuss dyspareunia.   Leona SingletonMaria T Tabari Volkert, MD [facility]

## 2013-12-31 NOTE — Assessment & Plan Note (Signed)
No fevers, chills, or abdominal pain outside of sex. DDx includes STD, functional pain with muscular tightness or insufficient lubrication. - Return to discuss per pt preference. - Return precautions reviewed.

## 2014-07-08 ENCOUNTER — Ambulatory Visit (INDEPENDENT_AMBULATORY_CARE_PROVIDER_SITE_OTHER): Payer: Medicaid Other | Admitting: Family Medicine

## 2014-07-08 ENCOUNTER — Other Ambulatory Visit: Payer: Self-pay | Admitting: *Deleted

## 2014-07-08 VITALS — BP 132/88 | HR 110 | Temp 98.3°F | Ht 64.0 in | Wt 181.6 lb

## 2014-07-08 DIAGNOSIS — L0291 Cutaneous abscess, unspecified: Secondary | ICD-10-CM

## 2014-07-08 DIAGNOSIS — L0390 Cellulitis, unspecified: Secondary | ICD-10-CM

## 2014-07-08 MED ORDER — CLINDAMYCIN HCL 300 MG PO CAPS: 300.0000 mg | ORAL_CAPSULE | Freq: Three times a day (TID) | ORAL | Status: DC

## 2014-07-08 NOTE — Patient Instructions (Signed)
Great to meet you!  You have a small abscess and cellulitis, this is a bacterial infection Take clindamycin for the infection PLace warm compresses 2-3 times daily, try gently massaging it in a hot bath You will likely see a small amount of discharge come out  If it does not resolve or returns after the antibiotics then we will have to drain it. If it does not begin to improve in the next 2-3 days then please don't hesitate to come back.  If you develop fevers, chills, difficulty tolerating oral intake please come back   Abscess An abscess is an infected area that contains a collection of pus and debris.It can occur in almost any part of the body. An abscess is also known as a furuncle or boil. CAUSES  An abscess occurs when tissue gets infected. This can occur from blockage of oil or sweat glands, infection of hair follicles, or a minor injury to the skin. As the body tries to fight the infection, pus collects in the area and creates pressure under the skin. This pressure causes pain. People with weakened immune systems have difficulty fighting infections and get certain abscesses more often.  SYMPTOMS Usually an abscess develops on the skin and becomes a painful mass that is red, warm, and tender. If the abscess forms under the skin, you may feel a moveable soft area under the skin. Some abscesses break open (rupture) on their own, but most will continue to get worse without care. The infection can spread deeper into the body and eventually into the bloodstream, causing you to feel ill.  DIAGNOSIS  Your caregiver will take your medical history and perform a physical exam. A sample of fluid may also be taken from the abscess to determine what is causing your infection. TREATMENT  Your caregiver may prescribe antibiotic medicines to fight the infection. However, taking antibiotics alone usually does not cure an abscess. Your caregiver may need to make a small cut (incision) in the abscess to  drain the pus. In some cases, gauze is packed into the abscess to reduce pain and to continue draining the area. HOME CARE INSTRUCTIONS   Only take over-the-counter or prescription medicines for pain, discomfort, or fever as directed by your caregiver.  If you were prescribed antibiotics, take them as directed. Finish them even if you start to feel better.  If gauze is used, follow your caregiver's directions for changing the gauze.  To avoid spreading the infection:  Keep your draining abscess covered with a bandage.  Wash your hands well.  Do not share personal care items, towels, or whirlpools with others.  Avoid skin contact with others.  Keep your skin and clothes clean around the abscess.  Keep all follow-up appointments as directed by your caregiver. SEEK MEDICAL CARE IF:   You have increased pain, swelling, redness, fluid drainage, or bleeding.  You have muscle aches, chills, or a general ill feeling.  You have a fever. MAKE SURE YOU:   Understand these instructions.  Will watch your condition.  Will get help right away if you are not doing well or get worse. Document Released: 12/29/2004 Document Revised: 09/20/2011 Document Reviewed: 06/03/2011 [facility] Patient Information 2015 [employer], [place]. This information is not intended to replace advice given to you by your health care provider. Make sure you discuss any questions you have with your health care provider.

## 2014-07-08 NOTE — Progress Notes (Signed)
Patient ID: Brandi Sanders, female   DOB: [birth year], 29 y.o.   MRN: [id number]   HPI  Patient presents today for SDA for concern for abscess  Patient explains that the lesion began as a small white pimple about 4 days ago. Steadily worsened since that time. It now is described as a painful hot lesion. She isn't tried any medications for it.  She denies fever, chills, sweats, change in oral intake. She also  Denies dyspnea, chest pain, and abdominal pain.  She's never had an abscess like this, although hidradenitis is on her problem list.  Smoking status noted ROS: Per HPI  Objective: BP 132/88 mmHg  Pulse 110  Temp(Src) 98.3 F (36.8 C) (Oral)  Ht 5\' 4"  (1.626 m)  Wt 181 lb 9.6 oz (82.373 kg)  BMI 31.16 kg/m2  LMP 06/10/2014 Gen: NAD, alert, cooperative with exam HEENT: NCAT CV: RRR, good S1/S2, no murmur Resp: CTABL, no wheezes, non-labored Ext: No edema, warm Neuro: Alert and oriented, No gross deficits  Skin:  Left inner thigh with approximately 3-4 cm area of induration with approximately 1 cm area of fluctuance on top. Small area of surrounding erythema approximately 4 cm  In diameter No drainage Tender to palpation  Assessment and plan:  Cellulitis and abscess Small abscess, not much fluctuance Discussed I&D, given small amount of fluctuance gave patient choices in Tx with I&D vs Abx and warm compresses, she would like to avoid I&D Clinda X 7 days, to cover MRSA and strep Warm compresses, gentle massage      Meds ordered this encounter  Medications  . clindamycin (CLEOCIN) 300 MG capsule    Sig: Take 1 capsule (300 mg total) by mouth 3 (three) times daily.    Dispense:  21 capsule    Refill:  0

## 2014-07-08 NOTE — Progress Notes (Signed)
I was the preceptor for this visit. 

## 2014-07-08 NOTE — Assessment & Plan Note (Addendum)
Small abscess, not much fluctuance Discussed I&D, given small amount of fluctuance gave patient choices in Tx with I&D vs Abx and warm compresses, she would like to avoid I&D Clinda X 7 days, to cover MRSA and strep Warm compresses, gentle massage

## 2014-07-09 ENCOUNTER — Telehealth: Payer: Self-pay | Admitting: Family Medicine

## 2014-07-09 DIAGNOSIS — M940 Chondrocostal junction syndrome [Tietze]: Secondary | ICD-10-CM

## 2014-07-09 MED ORDER — FLUTICASONE FUROATE 27.5 MCG/SPRAY NA SUSP: 2.0000 | Freq: Every day | NASAL | Status: AC

## 2014-07-09 MED ORDER — DICLOFENAC SODIUM 50 MG PO TBEC: 50.0000 mg | DELAYED_RELEASE_TABLET | Freq: Three times a day (TID) | ORAL | Status: DC | PRN

## 2014-07-09 NOTE — Telephone Encounter (Signed)
Was seen yesterday by Dr. Ermalinda MemosBradshaw for the bump on her inner leg and was prescribed antibiotics but was wondering if she could get an Rx for the pain as well. Please call and inform pt / thanks [name], ASA

## 2014-07-09 NOTE — Telephone Encounter (Signed)
Called and discussed.   Patient feels it is getting slightly worse. She has taken 3 clindamycin already. She is not feeling unwell.   I discussed if the area of redness gets worse or if it is not getting better by Friday to please come in. If the area of redness gets near her groin also come in right away.   Discussed yesterday to come back for fevers or chills  Also stated that I&D may be unavoidable if she does not improve on Abx and hot compresses  Murtis SinkSam Bradshaw, MD [facility] Resident, PGY-3 07/09/2014, 12:14 PM

## 2014-11-18 ENCOUNTER — Encounter (HOSPITAL_COMMUNITY): Payer: Self-pay | Admitting: *Deleted

## 2014-11-18 ENCOUNTER — Emergency Department (HOSPITAL_COMMUNITY)
Admission: EM | Admit: 2014-11-18 | Discharge: 2014-11-18 | Disposition: A | Discharge status: Home / Self Care | Payer: Medicaid Other | Source: Home / Self Care | Attending: Emergency Medicine | Admitting: Emergency Medicine

## 2014-11-18 DIAGNOSIS — M2011 Hallux valgus (acquired), right foot: Secondary | ICD-10-CM

## 2014-11-18 MED ORDER — MELOXICAM 15 MG PO TABS: 15.0000 mg | ORAL_TABLET | Freq: Every day | ORAL | Status: DC

## 2014-11-18 NOTE — ED Provider Notes (Signed)
CSN: [id number]     Arrival date & time 11/18/14  1921 History   First MD Initiated Contact with Patient 11/18/14 2045     Chief Complaint  Patient presents with  . Toe Pain   (Consider location/radiation/quality/duration/timing/severity/associated sxs/prior Treatment) HPI  She is a 29 year old woman here for evaluation of right great toe numbness. She states she has had numbness at the distal medial aspect of her great toe for the last month. She does also reports some pain at the lateral MTP joint with weightbearing. She states this started a few days after wearing a new pair of shoes.  She states the shoes were tight across to her toes. She realized issues were a problem 1-2 weeks later and has not been wearing them for the last 2 weeks.  Past Medical History  Diagnosis Date  . Abdominal pain   . Depression   . Medical history non-contributory    Past Surgical History  Procedure Laterality Date  . Vaginal deliveries  [id number]  . Umbilical hernia repair  2009  . Cold knife coniaztion  2007  . Dilation and curettage of uterus N/A 02/04/2013    Procedure: DILATATION AND CURETTAGE WITH IUD REMOVAL ;  Surgeon: Reva Bores, MD;  Location: [facility];  Service: Gynecology;  Laterality: N/A;   History reviewed. No pertinent family history. Social History  Substance Use Topics  . Smoking status: Current Every Day Smoker -- 0.50 packs/day for 10 years    Types: Cigarettes  . Smokeless tobacco: Never Used  . Alcohol Use: No   OB History    No data available     Review of Systems As in history of present illness Allergies  Review of patient's allergies indicates no known allergies.  Home Medications   Prior to Admission medications   Medication Sig Start Date End Date Taking? Authorizing Provider  diclofenac (VOLTAREN) 50 MG EC tablet Take 1 tablet (50 mg total) by mouth 3 (three) times daily as needed for moderate pain. 07/09/14   Elenora Gamma, MD  fluticasone (VERAMYST) 27.5  MCG/SPRAY nasal spray Place 2 sprays into the nose daily. 07/09/14 07/08/15  Twana First Hess, DO  meloxicam (MOBIC) 15 MG tablet Take 1 tablet (15 mg total) by mouth daily. For 2 weeks, then as needed 11/18/14   Charm Rings, MD  norethindrone (MICRONOR,CAMILA,ERRIN) 0.35 MG tablet Take 1 tablet (0.35 mg total) by mouth daily. 12/31/13   Leona Singleton, MD   LMP 10/27/2014 Physical Exam  Constitutional: She is oriented to person, place, and time. She appears well-developed and well-nourished. No distress.  Musculoskeletal:  Right great toe: No erythema or edema. There is a bunion present. She has decreased sensation to the distal medial toe. Good strength in the toe. Brisk cap refill.  Neurological: She is alert and oriented to person, place, and time.    ED Course  Procedures (including critical care time) Labs Review Labs Reviewed - No data to display  Imaging Review No results found.   MDM   1. Bunion of great toe, right    I suspect with her bunion and the tight fitting shoes, she has some inflammation and pressure injury to the superficial nerves. Meloxicam daily for 2 weeks, then as needed. Discussed that the sensation may return over the next several months as the nerves heal. Follow-up as needed.    Charm Rings, MD 11/18/14 2149

## 2014-11-18 NOTE — ED Notes (Signed)
Pt  Reports  Pain  r  Big  Toe     With   Numbness  To  The  l  Side      Of  The    Toe          denys   Any  Injury        Symptoms  X  1   Month           She  Has  Been  Soaking  Her  Toe

## 2014-11-18 NOTE — Discharge Instructions (Signed)
You have a bunion on your toe. With a shoes putting pressure across that area, the bunion is likely inflamed causing the discomfort. It also put pressure on the nerves which caused the numbness. The numbness may resolve over the next several months. Take meloxicam daily for the next 2 weeks, then as needed. Follow-up with your doctor as needed.

## 2015-06-18 ENCOUNTER — Encounter: Payer: Medicaid Other | Admitting: Family Medicine

## 2016-06-23 ENCOUNTER — Ambulatory Visit: Payer: Self-pay | Admitting: Obstetrics and Gynecology

## 2016-07-04 ENCOUNTER — Ambulatory Visit (INDEPENDENT_AMBULATORY_CARE_PROVIDER_SITE_OTHER): Payer: Self-pay | Admitting: Internal Medicine

## 2016-07-04 ENCOUNTER — Other Ambulatory Visit (HOSPITAL_COMMUNITY)
Admission: RE | Admit: 2016-07-04 | Discharge: 2016-07-04 | Disposition: A | Discharge status: Home / Self Care | Payer: Self-pay | Source: Ambulatory Visit | Attending: Family Medicine | Admitting: Family Medicine

## 2016-07-04 ENCOUNTER — Encounter: Payer: Self-pay | Admitting: Internal Medicine

## 2016-07-04 VITALS — BP 100/70 | HR 100 | Temp 98.8°F | Wt 156.0 lb

## 2016-07-04 DIAGNOSIS — Z124 Encounter for screening for malignant neoplasm of cervix: Secondary | ICD-10-CM

## 2016-07-04 DIAGNOSIS — Z01419 Encounter for gynecological examination (general) (routine) without abnormal findings: Secondary | ICD-10-CM | POA: Insufficient documentation

## 2016-07-04 DIAGNOSIS — Z113 Encounter for screening for infections with a predominantly sexual mode of transmission: Secondary | ICD-10-CM | POA: Insufficient documentation

## 2016-07-04 DIAGNOSIS — N898 Other specified noninflammatory disorders of vagina: Secondary | ICD-10-CM | POA: Insufficient documentation

## 2016-07-04 DIAGNOSIS — Z1151 Encounter for screening for human papillomavirus (HPV): Secondary | ICD-10-CM | POA: Insufficient documentation

## 2016-07-04 LAB — POCT WET PREP (WET MOUNT): Clue Cells Wet Prep Whiff POC: NEGATIVE

## 2016-07-04 LAB — POCT URINE PREGNANCY: PREG TEST UR: NEGATIVE

## 2016-07-04 MED ORDER — METRONIDAZOLE 500 MG PO TABS: 500.0000 mg | ORAL_TABLET | Freq: Two times a day (BID) | ORAL | 0 refills | Status: DC

## 2016-07-04 NOTE — Assessment & Plan Note (Signed)
Pt has been having unprotected intercourse. Not interested in discussing birth control at this time. - Urine pregnancy test negative today - Wet prep positive for trichomonas and BV - Prescribed Flagyl  bid x 7 days - Discussed with patient that her partner needs to see a doctor to have full STD testing performed. They should refrain from sexual intercourse or at least use condoms until 1-2 weeks after both partners are tested or treated. - Gonorrhea, chlamydia, HIV, and RPR ordered - Safe sexual practices discussed

## 2016-07-04 NOTE — Progress Notes (Signed)
   [facility] Phone: [phone]  Subjective:  Ms Schaff is a 31 year old woman who presents for two months of clear to white vaginal discharge and intermittent right-sided lower abdominal pain. The pain occurs 2-3 times per week and goes away on its own within a day. She describes it as "stabbing" and says that it does not radiate. She has noticed a fishy odor with the discharge. She is sexually active with one partner and they do not use condoms. She has had no change in urinary frequency or pain with urination and her last period was a week ago.  ROS: See HPI for pertinent positives and negatives  Past Medical History: Migraines Seasonal allergies Hidradenitis Dyspareunia Cellulitis and abscess   Objective: BP 100/70   Pulse 100   Temp 98.8 F (37.1 C) (Oral)   Wt 156 lb (70.8 kg)   LMP 06/26/2016   SpO2 91%   BMI 26.78 kg/m  Gen: NAD, alert, cooperative with exam HEENT: NCAT, EOMI, MMM Neck: FROM, supple CV: RRR, no murmur Resp: CTABL, no wheezes, normal work of breathing GI: SNTND, BS present, no guarding or organomegaly Msk: No edema, warm, normal tone, moves UE/LE spontaneously Neuro: Alert and oriented, no gross deficits Skin: No rashes, no lesions Psych: Appropriate behavior  Assessment/Plan: Vaginal discharge-Wet prep was positive for bacterial vaginosis and trichomonas infection. Have prescribed Flagyl BID for one week and counseled patient to have partner see a doctor for testing as well and avoid intercourse for 2-3 weeks after both are treated. Lower abdominal pain-No gross abnormalities on pelvic exam. No adnexal tenderness. Testing for gonorrhea, chlamydia, syphilis, will call with results. Health maintenance- Performed pap smear and pelvic exam.   Brandi Sanders MS3

## 2016-07-04 NOTE — Progress Notes (Signed)
   [facility] Phone: [phone]  Subjective:  Brandi Sanders is a 31 year old female presenting to clinic with vaginal discharge. She states that the discharge has been going on for many months. The discharge is white in color and has a bad odor. She is also having associated mild right lower quadrant abdominal pain. The pain comes and goes. The pain resolved on its own. Nothing makes the pain better or worse. She denies any dysuria, urinary frequency, urinary urgency. She is sexually active with one female partner. They do not use condoms. The patient is unsure if her partner is sexually active with only her, or if he has other sexual partners. She is not currently taking any birth control. She was previously taking Micronor. Last menstrual period was one week ago. Patient is also due for a Pap. No fevers, no chills.  ROS: See HPI for pertinent positives and negatives  Past Medical History- HTN, migraines  Family history reviewed for today's visit. No changes.  Social history- patient is a current smoker.  Objective: BP 100/70   Pulse 100   Temp 98.8 F (37.1 C) (Oral)   Wt 156 lb (70.8 kg)   LMP 06/26/2016   SpO2 91%   BMI 26.78 kg/m  Gen: NAD, alert, cooperative with exam GU: Normal external genitalia, vaginal walls normal in appearance, moderate amount of white vaginal discharge present, cervix normal, no cervical motion tenderness, ovaries and adnexna normal and non-tender to palpation, no masses appreciated.  Assessment/Plan: Vaginal Discharge: Pt has been having unprotected intercourse. Not interested in discussing birth control at this time. - Urine pregnancy test negative today - Wet prep positive for trichomonas and BV - Prescribed Flagyl  bid x 7 days - Discussed with patient that her partner needs to see a doctor to have full STD testing performed. They should refrain from sexual intercourse or at least use condoms until 1-2 weeks after both  partners are tested or treated. - Gonorrhea, chlamydia, HIV, and RPR ordered - Safe sexual practices discussed  Encounter for Cervical Cancer Screening: - Pap performed today - Will call patient with results.   Willadean Carol, MD PGY-2

## 2016-07-04 NOTE — Assessment & Plan Note (Signed)
-   Pap performed today - Will call patient with results 

## 2016-07-04 NOTE — Patient Instructions (Addendum)
It was so nice to meet you today! We did a pap smear and some routine testing today and I will call you with the results.  We have prescribed Flagyl. Please take this twice a day for 7 days.  We will see you back in one year or earlier if needed.

## 2016-07-05 LAB — HIV ANTIBODY (ROUTINE TESTING W REFLEX): HIV Screen 4th Generation wRfx: NONREACTIVE

## 2016-07-05 LAB — RPR: RPR Ser Ql: NONREACTIVE

## 2016-07-06 LAB — CYTOLOGY - PAP
Chlamydia: NEGATIVE
DIAGNOSIS: NEGATIVE
HPV (WINDOPATH): NOT DETECTED
NEISSERIA GONORRHEA: NEGATIVE

## 2016-09-20 ENCOUNTER — Ambulatory Visit: Payer: Medicaid Other | Admitting: Family Medicine

## 2016-09-22 ENCOUNTER — Ambulatory Visit: Payer: Medicaid Other | Admitting: Family Medicine

## 2016-09-23 ENCOUNTER — Encounter: Payer: Self-pay | Admitting: Family Medicine

## 2016-09-23 ENCOUNTER — Ambulatory Visit (INDEPENDENT_AMBULATORY_CARE_PROVIDER_SITE_OTHER): Payer: Self-pay | Admitting: Family Medicine

## 2016-09-23 VITALS — BP 122/78 | HR 99 | Temp 98.5°F | Ht 64.0 in | Wt 163.0 lb

## 2016-09-23 DIAGNOSIS — Z202 Contact with and (suspected) exposure to infections with a predominantly sexual mode of transmission: Secondary | ICD-10-CM

## 2016-09-23 DIAGNOSIS — N898 Other specified noninflammatory disorders of vagina: Secondary | ICD-10-CM

## 2016-09-23 LAB — POCT WET PREP (WET MOUNT)
CLUE CELLS WET PREP WHIFF POC: NEGATIVE
TRICHOMONAS WET PREP HPF POC: ABSENT

## 2016-09-23 MED ORDER — METRONIDAZOLE 500 MG PO TABS: 500.0000 mg | ORAL_TABLET | Freq: Two times a day (BID) | ORAL | 0 refills | Status: DC

## 2016-09-23 NOTE — Patient Instructions (Addendum)
Thank you for coming in today, it was so nice to see you! Today we talked about:    STD testing: Your tests were negative. Sometimes being on your period can mess with the results so we will be extra cautious and send in an antibiotic. Take as prescribed. Do not drink alcohol with this antibiotic.    If you have any questions or concerns, please do not hesitate to call the office at [phone]. You can also message me directly via MyChart.   Sincerely,  Anders Simmondshristina Gambino, MD

## 2016-09-23 NOTE — Progress Notes (Signed)
Subjective:    Patient ID: Brandi Sanders , female   DOB: 02-Apr-1986 , 31 y.o..   MRN: [id number]  HPI  Brandi Sanders is here for a same day visit for Chief Complaint  Patient presents with  . Exposure to STD    1. VAGINAL DISCHARGE  Patient is concerned she may have trich or BV again (recently had it 2 months ago). She does not think the partner who gave her trich got treated and she had sex with him again.  Having vaginal discharge for 2 days. Medications tried: None Discharge consistency: thin Discharge color: unknown Recent antibiotic use: no Sex in last month: yes Possible STD exposure: yes  Symptoms Fever: no Dysuria: no Vaginal bleeding: yes, started period today Abdomen or Pelvic pain: no Back pain: no Genital sores or ulcers: no Rash: no Pain during sex: no Missed menstrual period: no. Started period today  ROS see HPI Smoking Status noted  Past Medical History: Patient Active Problem List   Diagnosis Date Noted  . Vaginal discharge 07/04/2016  . Encounter for cervical Pap smear with pelvic exam 07/04/2016  . Essential hypertension, benign 01/21/2013  . Obesity 06/23/2011  . Unspecified episodic mood disorder 11/03/2010  . HIDRADENITIS 03/25/2010  . ALLERGIC RHINITIS, SEASONAL 08/07/2006  . MIGRAINE, UNSPEC., W/O INTRACTABLE MIGRAINE 06/01/2006    Medications: reviewed   Social Hx:  reports that she has been smoking Cigarettes.  She has a 5.00 pack-year smoking history. She has never used smokeless tobacco.   Objective:   BP 122/78   Pulse 99   Temp 98.5 F (36.9 C) (Oral)   Ht 5\' 4"  (1.626 m)   Wt 163 lb (73.9 kg)   LMP 09/16/2016   SpO2 97%   BMI 27.98 kg/m  Physical Exam  Gen: NAD, alert, cooperative with exam, well-appearing GYN:  External genitalia within normal limits.  Vaginal mucosa pink, moist, normal rugae.  Nonfriable cervix without lesions although view is obstructed with blood, bleeding noted on speculum exam and  pooling of fluid.  Bimanual exam not performed.  Results for orders placed or performed in visit on 09/23/16  POCT Wet Prep [facility] [place])  Result Value Ref Range   Source Wet Prep POC VAG    WBC, Wet Prep HPF POC RARE    Bacteria Wet Prep HPF POC Few Few   Clue Cells Wet Prep HPF POC Few (A) None   Clue Cells Wet Prep Whiff POC Negative Whiff    Yeast Wet Prep HPF POC None    Trichomonas Wet Prep HPF POC Absent Absent     Assessment & Plan:  Vaginal discharge On period today. Wet prep collected showing rare leuks and few clue cells. No trichomonas. Unclear if menstrual blood would interfere with wet prep results. Will go ahead and treat for possible BV with Flagyl 500 mg BID x 7 days. Also discussed safe sex practices, the need for her partner to be tested for STDs (as she had trichomonas 2 months ago and partner was never treated), and birth control. Patient not interested in birth control still. Return precautions discussed.   Orders Placed This Encounter  Procedures  . POCT Wet Prep [facility]([place] [place])   Meds ordered this encounter  Medications  . metroNIDAZOLE (FLAGYL) 500 MG tablet    Sig: Take 1 tablet (500 mg total) by mouth 2 (two) times daily.    Dispense:  14 tablet    Refill:  0    Anders Simmondshristina Deroy Noah, MD  [place], PGY-2

## 2016-09-26 NOTE — Assessment & Plan Note (Addendum)
On period today. Wet prep collected showing rare leuks and few clue cells. No trichomonas. Unclear if menstrual blood would interfere with wet prep results. Will go ahead and treat for possible BV with Flagyl 500 mg BID x 7 days. Also discussed safe sex practices, the need for her partner to be tested for STDs (as she had trichomonas 2 months ago and partner was never treated), and birth control. Patient not interested in birth control still. Return precautions discussed.

## 2017-02-07 ENCOUNTER — Encounter: Payer: Self-pay | Admitting: Internal Medicine

## 2017-02-07 ENCOUNTER — Other Ambulatory Visit (HOSPITAL_COMMUNITY)
Admission: RE | Admit: 2017-02-07 | Discharge: 2017-02-07 | Disposition: A | Discharge status: Home / Self Care | Payer: Medicaid Other | Source: Ambulatory Visit | Attending: Family Medicine | Admitting: Family Medicine

## 2017-02-07 ENCOUNTER — Other Ambulatory Visit: Payer: Self-pay

## 2017-02-07 ENCOUNTER — Ambulatory Visit (INDEPENDENT_AMBULATORY_CARE_PROVIDER_SITE_OTHER): Payer: Medicaid Other | Admitting: Internal Medicine

## 2017-02-07 VITALS — BP 118/78 | HR 98 | Temp 98.4°F | Ht 64.0 in | Wt 162.0 lb

## 2017-02-07 DIAGNOSIS — N912 Amenorrhea, unspecified: Secondary | ICD-10-CM

## 2017-02-07 DIAGNOSIS — Z202 Contact with and (suspected) exposure to infections with a predominantly sexual mode of transmission: Secondary | ICD-10-CM | POA: Insufficient documentation

## 2017-02-07 DIAGNOSIS — A5901 Trichomonal vulvovaginitis: Secondary | ICD-10-CM

## 2017-02-07 LAB — POCT WET PREP (WET MOUNT)
Clue Cells Wet Prep Whiff POC: POSITIVE
Trichomonas Wet Prep HPF POC: ABSENT

## 2017-02-07 LAB — POCT URINE PREGNANCY: PREG TEST UR: NEGATIVE

## 2017-02-07 MED ORDER — METRONIDAZOLE 500 MG PO TABS: 500.0000 mg | ORAL_TABLET | Freq: Two times a day (BID) | ORAL | 0 refills | Status: DC

## 2017-02-07 NOTE — Progress Notes (Signed)
   Subjective:   Patient: Brandi Sanders       Birthdate: Feb 14, 1986       MRN: [id number]      HPI  Brandi Sanders is a 31 y.o. female presenting for same day appt for concern for pregnancy and STD exposure.   Concern for pregnancy Patient had multiple episodes of vomiting yesterday and has felt nauseated today, and wanted to be sure that she is not pregnant. LMP was about one month ago but she cannot remember the exact date. Last episode of unprotected sex about a week ago. Patient not on any form of contraception. Does not use condoms.   STD exposure Patient's partner was informed yesterday that he has an infection, however he is unsure which one. He was given a medication to take, but patient is unsure which medication he was given. She wants to be tested for trich and GC/chlamydia today, however does not want HIV/RPR. She denies any symptoms, including vaginal discharge, odor, or urinary changes.   Smoking status reviewed. Patient is current every day smoker.   Review of Systems See HPI.     Objective:  Physical Exam  Constitutional: She is oriented to person, place, and time and well-developed, well-nourished, and in no distress.  HENT:  Head: Normocephalic and atraumatic.  Pulmonary/Chest: Effort normal. No respiratory distress.  Abdominal: Soft. She exhibits no distension. There is no tenderness.  Genitourinary: Uterus normal, cervix normal, right adnexa normal and left adnexa normal. Vaginal discharge (Thin white) found.  Neurological: She is alert and oriented to person, place, and time.  Skin: Skin is warm and dry.  Psychiatric: Affect and judgment normal.      Assessment & Plan:  Amenorrhea Upreg negative. Patient knows LMP was last month, but cannot remember date. Likely that patient's period simply has not started for this month. Unsure etiology of vomiting yesterday, however symptoms have resolved today and patient is well-appearing on exam. Patient not currently  on any form of contraception and is not interested in beginning any.   Exposure to STD Exposed by partner. Wet prep with clue cells. Declined HIV/RPR. GC/chlamydia pending.  - Metronidazole 500mg  BID x7d - Will call if GC/chlamydia positive - Safe sex counseling    Brandi AbernethyAbigail J Keyontay Stolz, MD, MPH PGY-3 [facility] Pager [phone]

## 2017-02-07 NOTE — Assessment & Plan Note (Signed)
Upreg negative. Patient knows LMP was last month, but cannot remember date. Likely that patient's period simply has not started for this month. Unsure etiology of vomiting yesterday, however symptoms have resolved today and patient is well-appearing on exam. Patient not currently on any form of contraception and is not interested in beginning any.

## 2017-02-07 NOTE — Assessment & Plan Note (Signed)
Exposed by partner. Wet prep with clue cells. Declined HIV/RPR. GC/chlamydia pending.  - Metronidazole 500mg  BID x7d - Will call if GC/chlamydia positive - Safe sex counseling

## 2017-02-07 NOTE — Patient Instructions (Addendum)
It was nice meeting you today Marcelino DusterMichelle!  For bacterial vaginosis, please begin taking the metronidazole tablets today. You will take one tablet twice a day for the next 7 days.   If there are any abnormalities with your other lab results, I will call to let you know.   If you have any questions or concerns, please feel free to call the clinic.   Be well,  Dr. Natale MilchLancaster

## 2017-02-08 LAB — CERVICOVAGINAL ANCILLARY ONLY
CHLAMYDIA, DNA PROBE: NEGATIVE
NEISSERIA GONORRHEA: NEGATIVE

## 2017-08-14 ENCOUNTER — Telehealth: Payer: Self-pay

## 2017-08-14 NOTE — Telephone Encounter (Signed)
Patient left message asking for refill on Flonase, not on current med list, to [facility]. No call back number given.  Ples Specter, RN [facility] RN)

## 2017-08-15 ENCOUNTER — Other Ambulatory Visit: Payer: Self-pay | Admitting: Internal Medicine

## 2017-08-15 MED ORDER — FLUTICASONE PROPIONATE 50 MCG/ACT NA SUSP: 2.0000 | Freq: Every day | NASAL | 6 refills | Status: DC

## 2017-08-15 NOTE — Telephone Encounter (Signed)
Prescription for flonase sent to patient's pharmacy. Thanks!

## 2017-08-27 ENCOUNTER — Emergency Department (HOSPITAL_COMMUNITY): Payer: Medicaid Other

## 2017-08-27 ENCOUNTER — Other Ambulatory Visit: Payer: Self-pay

## 2017-08-27 ENCOUNTER — Emergency Department (HOSPITAL_COMMUNITY)
Admission: EM | Admit: 2017-08-27 | Discharge: 2017-08-27 | Disposition: A | Discharge status: Home / Self Care | Payer: Medicaid Other | Attending: Emergency Medicine | Admitting: Emergency Medicine

## 2017-08-27 ENCOUNTER — Encounter (HOSPITAL_COMMUNITY): Payer: Self-pay

## 2017-08-27 DIAGNOSIS — Z79899 Other long term (current) drug therapy: Secondary | ICD-10-CM | POA: Insufficient documentation

## 2017-08-27 DIAGNOSIS — F17210 Nicotine dependence, cigarettes, uncomplicated: Secondary | ICD-10-CM | POA: Insufficient documentation

## 2017-08-27 DIAGNOSIS — J029 Acute pharyngitis, unspecified: Secondary | ICD-10-CM

## 2017-08-27 DIAGNOSIS — J069 Acute upper respiratory infection, unspecified: Secondary | ICD-10-CM | POA: Insufficient documentation

## 2017-08-27 LAB — GROUP A STREP BY PCR: Group A Strep by PCR: NOT DETECTED

## 2017-08-27 MED ORDER — PROMETHAZINE-DM 6.25-15 MG/5ML PO SYRP: 5.0000 mL | ORAL_SOLUTION | Freq: Four times a day (QID) | ORAL | 0 refills | Status: DC | PRN

## 2017-08-27 MED ORDER — SALINE SPRAY 0.65 % NA SOLN: 1.0000 | NASAL | 0 refills | Status: DC | PRN

## 2017-08-27 MED ORDER — ACETAMINOPHEN 325 MG PO TABS
650.0000 mg | ORAL_TABLET | Freq: Once | ORAL | Status: AC
  Administered 2017-08-27: 650 mg via ORAL
  Filled 2017-08-27: qty 2

## 2017-08-27 MED ORDER — DEXAMETHASONE 1 MG/ML PO CONC
10.0000 mg | Freq: Once | ORAL | Status: AC
  Administered 2017-08-27: 10 mg via ORAL
  Filled 2017-08-27: qty 10

## 2017-08-27 MED ORDER — AMOXICILLIN 500 MG PO CAPS
500.0000 mg | ORAL_CAPSULE | Freq: Once | ORAL | Status: AC
  Administered 2017-08-27: 500 mg via ORAL
  Filled 2017-08-27: qty 1

## 2017-08-27 MED ORDER — ACETAMINOPHEN 325 MG PO TABS: 650.0000 mg | ORAL_TABLET | Freq: Four times a day (QID) | ORAL | 0 refills | Status: DC | PRN

## 2017-08-27 MED ORDER — AMOXICILLIN 500 MG PO CAPS: 500.0000 mg | ORAL_CAPSULE | Freq: Two times a day (BID) | ORAL | 0 refills | Status: AC

## 2017-08-27 NOTE — ED Triage Notes (Signed)
PT C/O SORE THROAT, PRODUCTIVE COUGH, AND RUNNY NOSE X3 DAYS. PT STS SHE HAS BEEN AROUND OTHERS WITH STREP THROAT, AND THINKS SHE MAY HAVE IT. DENIES FEVER OR CHILLS. PT STS SHE COUGHS SO HARD THAT SHE VOMITS, BUT DENIES NAUSEA.

## 2017-08-27 NOTE — Discharge Instructions (Signed)
As discussed, make sure that you drink plenty of fluids and stay well-hydrated.  Your entire course of antibiotics even if you feel better.  Use tea with honey, cough drops, over-the-counter throat sprays to help soothe your throat.  Tylenol every 6 hours as needed.  Use your cough medication as needed but do not drive if you take this medication. Follow-up with your primary care provider or the wellness center to establish care with a primary. Return if symptoms worsen, facial swelling, difficulty breathing, drooling, or any other new concerning symptoms in the meantime.

## 2017-08-27 NOTE — ED Provider Notes (Signed)
[facility]-EMERGENCY DEPT Provider Note   CSN: [id number] Arrival date & time: 08/27/17  1436     History   Chief Complaint Chief Complaint  Patient presents with  . Sore Throat  . Cough    HPI Brandi Sanders is a 32 y.o. female with no past medical history presenting with 3 days of cold symptoms including waking up with a sore throat and subsequent cough with posttussive emesis, myalgias and decreased appetite.  Patient reports being up-to-date on immunization except for flu.  Does have known ill contacts with positive strep and cold symptoms at work.  She has tried DayQuil without relief.  She denies any fever, nausea, abdominal pain or diarrhea.  HPI  Past Medical History:  Diagnosis Date  . Abdominal pain   . Depression   . Medical history non-contributory     Patient Active Problem List   Diagnosis Date Noted  . Exposure to STD 02/07/2017  . Vaginal discharge 07/04/2016  . Encounter for cervical Pap smear with pelvic exam 07/04/2016  . Essential hypertension, benign 01/21/2013  . Obesity 06/23/2011  . Unspecified episodic mood disorder 11/03/2010  . HIDRADENITIS 03/25/2010  . ALLERGIC RHINITIS, SEASONAL 08/07/2006  . Amenorrhea 07/14/2006  . MIGRAINE, UNSPEC., W/O INTRACTABLE MIGRAINE 06/01/2006    Past Surgical History:  Procedure Laterality Date  . cold knife coniaztion  2007  . DILATION AND CURETTAGE OF UTERUS N/A 02/04/2013   Procedure: DILATATION AND CURETTAGE WITH IUD REMOVAL ;  Surgeon: Brandi Bores, MD;  Location: [facility];  Service: Gynecology;  Laterality: N/A;  . UMBILICAL HERNIA REPAIR  2009  . vaginal deliveries  2006,2010     OB History   None      Home Medications    Prior to Admission medications   Medication Sig Start Date End Date Taking? Authorizing Provider  acetaminophen (TYLENOL) 325 MG tablet Take 2 tablets (650 mg total) by mouth every 6 (six) hours as needed for mild pain or moderate pain. 08/27/17    Mathews Robinsons B, PA-C  amoxicillin (AMOXIL) 500 MG capsule Take 1 capsule (500 mg total) by mouth 2 (two) times daily for 10 days. 08/27/17 09/06/17  Georgiana Shore, PA-C  diclofenac (VOLTAREN) 50 MG EC tablet Take 1 tablet (50 mg total) by mouth 3 (three) times daily as needed for moderate pain. 07/09/14   Elenora Gamma, MD  fluticasone (FLONASE) 50 MCG/ACT nasal spray Place 2 sprays into both nostrils daily. 08/15/17   Mayo, Allyn Kenner, MD  meloxicam (MOBIC) 15 MG tablet Take 1 tablet (15 mg total) by mouth daily. For 2 weeks, then as needed 11/18/14   Charm Rings, MD  metroNIDAZOLE (FLAGYL) 500 MG tablet Take 1 tablet (500 mg total) by mouth 2 (two) times daily. 09/23/16   Beaulah Dinning, MD  metroNIDAZOLE (FLAGYL) 500 MG tablet Take 1 tablet (500 mg total) 2 (two) times daily by mouth. 02/07/17   Marquette Saa, MD  promethazine-dextromethorphan (PROMETHAZINE-DM) 6.25-15 MG/5ML syrup Take 5 mLs by mouth 4 (four) times daily as needed for cough. 08/27/17   Mathews Robinsons B, PA-C  sodium chloride (OCEAN) 0.65 % SOLN nasal spray Place 1 spray into both nostrils as needed for congestion. 08/27/17   Georgiana Shore PA-C    Family History History reviewed. No pertinent family history.  Social History Social History   Tobacco Use  . Smoking status: Current Every Day Smoker    Packs/day: 0.25    Years: 10.00  Pack years: 2.50    Types: Cigarettes  . Smokeless tobacco: Never Used  Substance Use Topics  . Alcohol use: No  . Drug use: Yes    Frequency: 1.0 times per week    Types: Marijuana     Allergies   Patient has no known allergies.   Review of Systems Review of Systems  Constitutional: Positive for appetite change, chills, diaphoresis and fatigue. Negative for fever.  HENT: Positive for congestion and sore throat. Negative for ear discharge, ear pain, facial swelling, tinnitus and voice change.   Eyes: Negative for photophobia, pain, redness and  visual disturbance.  Respiratory: Positive for cough. Negative for choking, chest tightness, shortness of breath, wheezing and stridor.        Patient reports chest wall tenderness with coughing  Cardiovascular: Negative for chest pain, palpitations and leg swelling.  Gastrointestinal: Positive for vomiting. Negative for abdominal pain, diarrhea and nausea.  Genitourinary: Negative for difficulty urinating.  Musculoskeletal: Positive for myalgias. Negative for arthralgias, back pain, gait problem, joint swelling, neck pain and neck stiffness.       Upper and lower extremities  Skin: Negative for color change, pallor and rash.  Neurological: Negative for dizziness, syncope, weakness, light-headedness and numbness.     Physical Exam Updated Vital Signs BP 118/85 (BP Location: Right Arm)   Pulse 90   Temp 99.2 F (37.3 C) (Oral)   Resp 18   Ht  (1.626 m)   Wt 74.8 kg (165 lb)   LMP 08/06/2017   SpO2 99%   BMI 28.32 kg/m   Physical Exam  Constitutional: She appears well-developed and well-nourished.  Non-toxic appearance. She does not appear ill. No distress.  Afebrile, nontoxic-appearing, sitting comfortably in chair in no acute distress.  HENT:  Head: Normocephalic and atraumatic.  Mouth/Throat: Uvula is midline and mucous membranes are normal. Mucous membranes are not pale, not dry and not cyanotic. No oral lesions. No uvula swelling. Posterior oropharyngeal erythema present. No oropharyngeal exudate, posterior oropharyngeal edema or tonsillar abscesses. Tonsils are 0 on the right. Tonsils are 0 on the left. Tonsillar exudate.  Eyes: Conjunctivae are normal.  Neck: Normal range of motion. Neck supple.  Cardiovascular: Normal rate, regular rhythm and normal heart sounds.  No murmur heard. Pulmonary/Chest: Effort normal and breath sounds normal. No stridor. No respiratory distress. She has no wheezes. She has no rhonchi. She has no rales.  Abdominal: She exhibits no  distension.  Musculoskeletal: She exhibits no edema.  Lymphadenopathy:    She has cervical adenopathy.  Neurological: She is alert.  Skin: Skin is warm and dry. No rash noted. She is not diaphoretic. No erythema. No pallor.  Psychiatric: She has a normal mood and affect.  Nursing note and vitals reviewed.    ED Treatments / Results  Labs (all labs ordered are listed, but only abnormal results are displayed) Labs Reviewed  GROUP A STREP BY PCR    EKG None  Radiology Dg Chest 2 View  Result Date: 08/27/2017 CLINICAL DATA:  Cough for 3 days EXAM: CHEST - 2 VIEW COMPARISON:  March 24, 2012 FINDINGS: No edema or consolidation. The heart size and pulmonary vascularity are normal. No pneumothorax. No adenopathy. No bone lesions. IMPRESSION: No edema or consolidation. Electronically Signed   By: Bretta Bang III M.D.   On: 08/27/2017 16:04    Procedures Procedures (including critical care time)  Medications Ordered in ED Medications  dexamethasone (DECADRON) 1 MG/ML solution 10 mg (10 mg Oral Given  08/27/17 1916)  acetaminophen (TYLENOL) tablet 650 mg (650 mg Oral Given 08/27/17 1916)  amoxicillin (AMOXIL) capsule 500 mg (500 mg Oral Given 08/27/17 1916)     Initial Impression / Assessment and Plan / ED Course  I have reviewed the triage vital signs and the nursing notes.  Pertinent labs & imaging results that were available during my care of the patient were reviewed by me and considered in my medical decision making (see chart for details).    Patient presents with 3 days of upper respiratory infection symptoms. She has known ill contacts with positive test for strep and cold symptoms at work.  Exam she has tonsillar exudate and erythema.  Though her rapid strep is negative we will treat for strep given physical exam findings and known contacts with positive strep. Pt afebrile with tonsillar exudate, cervical lymphadenopathy, & odynophagia; diagnosis of strep. Treated  in the Ed with steroids, NSAIDs, Pain medication. Pt does not appears dehydrated, but discussed importance of water rehydration.  Presentation non concerning for PTA or infxn spread to soft tissue. No trismus or uvula deviation. Specific return precautions discussed. Pt able to drink water in ED without difficulty with intact airway. Recommended PCP follow up.   Patient was given Decadron while in the emergency department and improved.  She is tolerating p.o.  Discharge home with symptomatic relief, amoxicillin and close follow-up with PCP. Discussed return precautions and patient understands and agrees with plan.  Final Clinical Impressions(s) / ED Diagnoses   Final diagnoses:  Pharyngitis, unspecified etiology  Viral upper respiratory tract infection    ED Discharge Orders        Ordered    amoxicillin (AMOXIL) 500 MG capsule  2 times daily     08/27/17 1908    acetaminophen (TYLENOL) 325 MG tablet  Every 6 hours PRN     08/27/17 1908    promethazine-dextromethorphan (PROMETHAZINE-DM) 6.25-15 MG/5ML syrup  4 times daily PRN     08/27/17 1908    sodium chloride (OCEAN) 0.65 % SOLN nasal spray  As needed     08/27/17 1949       Gregary Cromer 08/27/17 1952    Jacalyn Lefevre, MD 08/27/17 [phone]

## 2017-08-27 NOTE — ED Notes (Signed)
Offered pt 8 oz of water for fluid challenge pt tolerated well.

## 2017-11-17 ENCOUNTER — Ambulatory Visit: Payer: Medicaid Other

## 2018-01-02 ENCOUNTER — Ambulatory Visit: Payer: Medicaid Other | Admitting: Family Medicine

## 2018-01-15 ENCOUNTER — Other Ambulatory Visit: Payer: Self-pay

## 2018-01-15 ENCOUNTER — Encounter: Payer: Self-pay | Admitting: Family Medicine

## 2018-01-15 ENCOUNTER — Ambulatory Visit (INDEPENDENT_AMBULATORY_CARE_PROVIDER_SITE_OTHER): Payer: Self-pay | Admitting: Family Medicine

## 2018-01-15 VITALS — BP 120/70 | HR 66 | Temp 98.5°F | Ht 64.0 in | Wt 169.0 lb

## 2018-01-15 DIAGNOSIS — N979 Female infertility, unspecified: Secondary | ICD-10-CM | POA: Insufficient documentation

## 2018-01-15 HISTORY — DX: Female infertility, unspecified: N97.9

## 2018-01-15 MED ORDER — PRENATAL 1 30-0.975-200 MG PO CAPS: 1.0000 | ORAL_CAPSULE | Freq: Every day | ORAL | 5 refills | Status: AC

## 2018-01-15 NOTE — Assessment & Plan Note (Signed)
Patient is actively trying to conceive child with husband. They have been successful twice in the past. Patient and husband have been having intercourse before and after menses. There are no other obvious health concerns prohibiting the patient from conceiving. - Patient encouraged to keep Menstruation Diary to follow timing of periods, as well as when she is attempting to conceive during her cycle. - Patient instructed to change timing of intercourse to before and during ovulation. - Started Pre-natal Vitamin - Follow up in 3 months for diary check and further counseling. Consider Labs for BMP, CBC, TSH, LH, and FSH. Also consider bimanual exam at next visit.

## 2018-01-15 NOTE — Patient Instructions (Addendum)
Thank you for coming in to see [place] today! Please see below to review our plan for today's visit:  1. Decrease smoking as much you can! 2. Start on prenatal vitamin. 3. Keep menstruation diary.   Please call the clinic at [phone] if your symptoms worsen or you have any concerns. It was our pleasure to serve you!  Dr. Peggyann Shoals [facility]

## 2018-01-15 NOTE — Progress Notes (Signed)
     Subjective:    Patient ID: Brandi Sanders, female    DOB: 12/19/1985, 32 y.o.   MRN: [id number]  CC: Discuss getting pregnant  HPI: Patient has 2 children with her husband and is desiring a third. She reports trying to get pregnant for 3 years. Patient states she and her husband are having unprotected sex 4-5 times monthly before and after periods. She quite birth control (Mirena) in 2015.  Menstrual/Sexual history: She states menses last 4-6 days, varies in flow (heavy at beginning to light), denies severe dysmenorrhea, experiences breast tenderness, ovulatory pain, and bloating during menses. Medical, surgical, and gynecological history: 1x history of trich treated, denies pelivic inflammatory disease, had Cryo 10 years ago 2009-ish. She denies Thyroid disease (denies hair changes, constipation/diarrhea, weight changes without a cause), but reports having Hirsutism of her face and chin since her last pregnancy 10 years ago, but there is no evidence of female-pattern baldness. She reports occasional dyspareunia.   Patient denies family history of infertility, birth defects, genetic mutations, or mental retardation. She reports her life is only minimally stressful. She continues to smoke 2-3 cigarettes daily but has cut way back form where she used to.  Smoking status reviewed: Patient continues to smoke 2 to 3 cigarettes daily, reports she quits smoking during pregnancy.  Review of Systems  Constitutional: Negative for chills and fever.  Respiratory: Negative for cough.   Cardiovascular: Negative for chest pain.  Gastrointestinal: Negative for abdominal pain, constipation, diarrhea, nausea and vomiting.  Genitourinary: Negative for dysuria and urgency.    Objective:  BP 120/70   Pulse 66   Temp 98.5 F (36.9 C) (Oral)   Ht 5\' 4"  (1.626 m)   Wt 169 lb (76.7 kg)   LMP 01/01/2018 (Approximate)   SpO2 99%   BMI 29.01 kg/m  Vitals and nursing note reviewed  Physical Exam    Constitutional: She is well-developed, well-nourished, and in no distress. No distress.  HENT:  Head: Normocephalic.  No facial hair observed on this exam  Eyes: EOM are normal.  Neck: Normal range of motion.  Cardiovascular: Normal rate, regular rhythm, normal heart sounds and intact distal pulses.  Pulmonary/Chest: Effort normal and breath sounds normal. No respiratory distress.  Abdominal: Soft. Bowel sounds are normal. She exhibits no distension.  Obese abdomen, BMI 29.  Musculoskeletal: Normal range of motion. She exhibits no edema.  Neurological: She is alert.  Skin: Skin is warm.  Psychiatric: Affect normal.   Assessment & Plan:   Infertility, female Patient is actively trying to conceive child with husband. They have been successful twice in the past. Patient and husband have been having intercourse before and after menses. There are no other obvious health concerns prohibiting the patient from conceiving. - Patient encouraged to keep Menstruation Diary to follow timing of periods, as well as when she is attempting to conceive during her cycle. - Patient instructed to change timing of intercourse to before and during ovulation. - Started Pre-natal Vitamin - Follow up in 3 months for diary check and further counseling. Consider Labs for BMP, CBC, TSH, LH, and FSH. Also consider bimanual exam at next visit.   Return in about 3 months (around 04/17/2018) for Check diary, potential labs for infertility workup, +/- Pelvic Exam .   Dr. Peggyann Shoals [facility], PGY-1

## 2018-02-15 ENCOUNTER — Ambulatory Visit: Payer: Medicaid Other

## 2018-02-15 ENCOUNTER — Encounter: Payer: Self-pay | Admitting: Family Medicine

## 2018-02-15 ENCOUNTER — Other Ambulatory Visit: Payer: Self-pay

## 2018-02-15 ENCOUNTER — Ambulatory Visit (INDEPENDENT_AMBULATORY_CARE_PROVIDER_SITE_OTHER): Payer: Self-pay | Admitting: Family Medicine

## 2018-02-15 VITALS — BP 125/80 | HR 72 | Temp 98.2°F | Wt 170.0 lb

## 2018-02-15 DIAGNOSIS — N939 Abnormal uterine and vaginal bleeding, unspecified: Secondary | ICD-10-CM

## 2018-02-15 DIAGNOSIS — Z3202 Encounter for pregnancy test, result negative: Secondary | ICD-10-CM

## 2018-02-15 LAB — POCT URINE PREGNANCY: Preg Test, Ur: NEGATIVE

## 2018-02-15 MED ORDER — MEGESTROL ACETATE 40 MG PO TABS: 40.0000 mg | ORAL_TABLET | Freq: Two times a day (BID) | ORAL | 0 refills | Status: AC

## 2018-02-15 NOTE — Patient Instructions (Addendum)
It was nice to meet you today.    There can be several causes for abnormal vaginal bleeding.  We are getting some blood tests and arranging an ultrasound to help determine the cause.  In the meantime, we have prescribed a medicine that will help stop the bleeding.  It is called megace and you will take it twice a day for 14 days.  Please follow up with us in two weeks so we can discuss the results of the tests and see if the bleeding has changed.    Have a good day,   Frederic Jerichoan Olson, MD.

## 2018-02-15 NOTE — Progress Notes (Signed)
   [facility] Phone: [phone]   cc: vaginal bleeding  Subjective:  Started her period on 10/19.  First three days spotting.  Next five days was a full period.  Then stopped for 24 hours and Then every day since then.  Her periods are Every 28 days occurring regularly. Her last period came early.  Last one before this was the last week of September.  She is having No odor/pain/itching/burning/dysuria.  Currently she is just using pads which are Changed every 2-3 hours.  She Will see blood in the toilet when she urinates.  Most of the blood comes out when she uses the bathroom.  There is just light bleeding on the pad.  She is currently Having sex with husband.  They have been trying to have a child unsuccessfully for three years.  She has two other children.  Last time they had sex was a week before the period started.  They Haven't had intercourse since then.  No history of cancers in family. She Had an IUD taken out in 2015.  Did not have periods at all at that time but has been regular since that time.     ROS: See HPI for pertinent positives and negatives  Past Medical History  Family history reviewed for today's visit. No changes.  Social history- patient is a current smoker.  A few cigarettes a day.    Objective: BP 125/80   Pulse 72   Temp 98.2 F (36.8 C) (Oral)   Wt 170 lb (77.1 kg)   LMP 01/20/2018   SpO2 99%   BMI 29.18 kg/m  Gen: NAD, alert and oriented, cooperative with exam HEENT: NCAT, EOMI, MMM GU: no visible lesions on the labia, vaginal walls or cervix.  Blood seen on the labia, surrounding cervix and pooling in the vagina.  No cervical motion tenderness or adnexal tenderness.  Skin: No rashes, no lesions Psych: Appropriate behavior  Assessment/Plan: Abnormal uterine bleeding Patient has had bleeding every day since her period ended on 10/27.  Notices the bleeding mostly when she uses the bathroom.  She is otherwise asymptomatic.   Pregnancy test was negative.  CBC, PT, INR, TSH were ordered and transvaginal ultrasound was scheduled.  Polyp is the most likely cause, but differential diagnosis includes coagulopathy, leiomyoma, ovulatory dysfunction, and other causes of abnormal uterine bleeding. We prescribed megace 40mg  BID for 14 days to help control bleeding. Told patient to make followup appointment in two weeks.      Frederic Jerichoan Olson, MD PGY-1

## 2018-02-15 NOTE — Assessment & Plan Note (Signed)
Patient has had bleeding every day since her period ended on 10/27.  Notices the bleeding mostly when she uses the bathroom.  She is otherwise asymptomatic.  Pregnancy test was negative.  CBC, PT, INR, TSH were ordered and transvaginal ultrasound was scheduled.  Polyp is the most likely cause, but differential diagnosis includes coagulopathy, leiomyoma, ovulatory dysfunction, and other causes of abnormal uterine bleeding. We prescribed megace 40mg  BID for 14 days to help control bleeding. Told patient to make followup appointment in two weeks.

## 2018-02-15 NOTE — Addendum Note (Signed)
Addended by: Janit PaganENIOLA, Li Fragoso T on: 02/15/2018 11:58 AM   Modules accepted: Level of Service

## 2018-02-16 ENCOUNTER — Encounter: Payer: Self-pay | Admitting: Family Medicine

## 2018-02-16 ENCOUNTER — Telehealth: Payer: Self-pay

## 2018-02-16 LAB — CBC
HEMATOCRIT: 37.1 % (ref 34.0–46.6)
Hemoglobin: 13.1 g/dL (ref 11.1–15.9)
MCH: 31.3 pg (ref 26.6–33.0)
MCHC: 35.3 g/dL (ref 31.5–35.7)
MCV: 89 fL (ref 79–97)
PLATELETS: 243 10*3/uL (ref 150–450)
RBC: 4.18 x10E6/uL (ref 3.77–5.28)
RDW: 13.4 % (ref 12.3–15.4)
WBC: 5.3 10*3/uL (ref 3.4–10.8)

## 2018-02-16 LAB — TSH: TSH: 1.42 u[IU]/mL (ref 0.450–4.500)

## 2018-02-16 LAB — PROTIME-INR
INR: 1 (ref 0.8–1.2)
Prothrombin Time: 10.7 s (ref 9.1–12.0)

## 2018-02-16 NOTE — Telephone Encounter (Signed)
[facility] that Megestrol requires a PA.   Upon review, [facility] has [facility] and it does not cover this medication as it does not fit their guidelines.   Phoned pharmacy and left message that patient will have to pay for this medication out of pocket. Note to prescriber for info.  Ples SpecterAlisa Brake, RN [facility] RN)

## 2018-02-19 NOTE — Telephone Encounter (Signed)
If patient cannot afford Megace bc it is not covered, then we will re-evaluate her bleeding during her next appointment after her ultrasound.  No concern for anemia or excessive blood loss at this time.

## 2018-02-22 ENCOUNTER — Telehealth: Payer: Self-pay | Admitting: Family Medicine

## 2018-02-22 ENCOUNTER — Encounter: Payer: Self-pay | Admitting: Family Medicine

## 2018-02-22 ENCOUNTER — Ambulatory Visit (HOSPITAL_COMMUNITY)
Admission: RE | Admit: 2018-02-22 | Discharge: 2018-02-22 | Disposition: A | Discharge status: Home / Self Care | Payer: Medicaid Other | Source: Ambulatory Visit | Attending: Family Medicine | Admitting: Family Medicine

## 2018-02-22 DIAGNOSIS — N8500 Endometrial hyperplasia, unspecified: Secondary | ICD-10-CM

## 2018-02-22 DIAGNOSIS — N939 Abnormal uterine and vaginal bleeding, unspecified: Secondary | ICD-10-CM | POA: Insufficient documentation

## 2018-02-22 DIAGNOSIS — N840 Polyp of corpus uteri: Secondary | ICD-10-CM

## 2018-02-22 NOTE — Telephone Encounter (Signed)
I called again without any response. HIPAA compliant message left.  Please advise her to schedule f/u appointment with PCP to further discuss test result or if you are able to connect her with me when she calls please do so.

## 2018-02-22 NOTE — Telephone Encounter (Signed)
HIPAA compliant call back message left.   Note: Pelvic US shows endometrial thickness and polyps.  I already placed referral to Gyn. We will inform her when she calls.

## 2018-02-22 NOTE — Telephone Encounter (Signed)
PT lm on nurse line ( we are playing phone tag).  States that she will be home the rest of the pm @ [phone].  Fleeger, Maryjo RochesterJessica Dawn, CMA

## 2018-02-22 NOTE — Telephone Encounter (Signed)
Pt called nurse line returning your call. I informed of findings and the referral to GYN. Pt would like to still speak with Eniola. I informed pt to have her phone on her to await call.

## 2018-02-23 NOTE — Addendum Note (Signed)
Addended by: Janit PaganENIOLA, KEHINDE T on: 02/23/2018 10:37 AM   Modules accepted: Orders

## 2018-02-26 NOTE — Telephone Encounter (Signed)
I was able to able to discuss US result with her and the need for Gyn referral. She does not have any question at this time. She verbalized understanding and agreed with the recommendation.

## 2018-05-01 ENCOUNTER — Ambulatory Visit: Payer: Self-pay | Admitting: Family Medicine

## 2018-05-01 ENCOUNTER — Emergency Department (HOSPITAL_COMMUNITY)
Admission: EM | Admit: 2018-05-01 | Discharge: 2018-05-01 | Disposition: A | Discharge status: Home / Self Care | Payer: Medicaid Other | Attending: Emergency Medicine | Admitting: Emergency Medicine

## 2018-05-01 ENCOUNTER — Other Ambulatory Visit: Payer: Self-pay

## 2018-05-01 DIAGNOSIS — F1290 Cannabis use, unspecified, uncomplicated: Secondary | ICD-10-CM | POA: Insufficient documentation

## 2018-05-01 DIAGNOSIS — I10 Essential (primary) hypertension: Secondary | ICD-10-CM | POA: Insufficient documentation

## 2018-05-01 DIAGNOSIS — R51 Headache: Secondary | ICD-10-CM | POA: Insufficient documentation

## 2018-05-01 DIAGNOSIS — J111 Influenza due to unidentified influenza virus with other respiratory manifestations: Secondary | ICD-10-CM | POA: Insufficient documentation

## 2018-05-01 DIAGNOSIS — R05 Cough: Secondary | ICD-10-CM | POA: Insufficient documentation

## 2018-05-01 DIAGNOSIS — F17210 Nicotine dependence, cigarettes, uncomplicated: Secondary | ICD-10-CM | POA: Insufficient documentation

## 2018-05-01 DIAGNOSIS — M7918 Myalgia, other site: Secondary | ICD-10-CM | POA: Insufficient documentation

## 2018-05-01 DIAGNOSIS — R69 Illness, unspecified: Secondary | ICD-10-CM

## 2018-05-01 MED ORDER — ACETAMINOPHEN 500 MG PO TABS
1000.0000 mg | ORAL_TABLET | Freq: Once | ORAL | Status: AC
  Administered 2018-05-01: 1000 mg via ORAL
  Filled 2018-05-01: qty 2

## 2018-05-01 NOTE — ED Triage Notes (Signed)
Pt here from with flu like symptoms times 1 week , chills and body aches

## 2018-05-01 NOTE — ED Notes (Signed)
Patient verbalizes understanding of discharge instructions. Opportunity for questioning and answers were provided. 

## 2018-05-01 NOTE — Discharge Instructions (Signed)
Make sure that you drink at least six 8 ounce glasses of water or Gatorade each day in order to stay well-hydrated.  Take Tylenol as directed every 4 hours for temperature higher than 100.4 or for aches.  See your doctor at the family medicine center if not feeling better by Monday, May 07, 2018.  Ask your primary care doctor to help you to stop smoking

## 2018-05-01 NOTE — ED Provider Notes (Addendum)
[facility] EMERGENCY DEPARTMENT Provider Note   CSN: [id number] Arrival date & time: 05/01/18  1244     History   Chief Complaint No chief complaint on file.   HPI Brandi Sanders is a 33 y.o. female.  HPI Complains of diffuse myalgias, cough sneeze and fever onset 4 days ago.  Symptoms accompanied by mild sore throat, mild headache, she vomited twice last night.  No diarrhea.  Had fever of 102 degrees this morning.  Treated herself with ibuprofen last dose 7 AM today.  Nothing makes symptoms better or worse.  No other associated symptoms Past Medical History:  Diagnosis Date  . Abdominal pain   . Depression   . Medical history non-contributory     Patient Active Problem List   Diagnosis Date Noted  . Abnormal uterine bleeding 02/15/2018  . Infertility, female 01/15/2018  . Exposure to STD 02/07/2017  . Vaginal discharge 07/04/2016  . Encounter for cervical Pap smear with pelvic exam 07/04/2016  . Essential hypertension, benign 01/21/2013  . Obesity 06/23/2011  . Unspecified episodic mood disorder 11/03/2010  . HIDRADENITIS 03/25/2010  . ALLERGIC RHINITIS, SEASONAL 08/07/2006  . MIGRAINE, UNSPEC., W/O INTRACTABLE MIGRAINE 06/01/2006    Past Surgical History:  Procedure Laterality Date  . cold knife coniaztion  2007  . DILATION AND CURETTAGE OF UTERUS N/A 02/04/2013   Procedure: DILATATION AND CURETTAGE WITH IUD REMOVAL ;  Surgeon: Reva Bores, MD;  Location: [facility];  Service: Gynecology;  Laterality: N/A;  . UMBILICAL HERNIA REPAIR  2009  . vaginal deliveries  2006,2010     OB History   No obstetric history on file.      Home Medications    Prior to Admission medications   Medication Sig Start Date End Date Taking? Authorizing Provider  acetaminophen (TYLENOL) 325 MG tablet Take 2 tablets (650 mg total) by mouth every 6 (six) hours as needed for mild pain or moderate pain. 08/27/17   Georgiana Shore, PA-C  diclofenac (VOLTAREN) 50  MG EC tablet Take 1 tablet (50 mg total) by mouth 3 (three) times daily as needed for moderate pain. 07/09/14   Elenora Gamma, MD  fluticasone (FLONASE) 50 MCG/ACT nasal spray Place 2 sprays into both nostrils daily. 08/15/17   Mayo, Allyn Kenner, MD  meloxicam (MOBIC) 15 MG tablet Take 1 tablet (15 mg total) by mouth daily. For 2 weeks, then as needed 11/18/14   Charm Rings, MD  metroNIDAZOLE (FLAGYL) 500 MG tablet Take 1 tablet (500 mg total) by mouth 2 (two) times daily. 09/23/16   Beaulah Dinning, MD  metroNIDAZOLE (FLAGYL) 500 MG tablet Take 1 tablet (500 mg total) 2 (two) times daily by mouth. 02/07/17   Marquette Saa, MD  promethazine-dextromethorphan (PROMETHAZINE-DM) 6.25-15 MG/5ML syrup Take 5 mLs by mouth 4 (four) times daily as needed for cough. 08/27/17   Mathews Robinsons B, PA-C  sodium chloride (OCEAN) 0.65 % SOLN nasal spray Place 1 spray into both nostrils as needed for congestion. 08/27/17   Mathews Robinsons B, PA-C   Only current medication is ibuprofen Family History No family history on file.  Social History Social History   Tobacco Use  . Smoking status: Current Every Day Smoker    Packs/day: 0.25    Years: 10.00    Pack years: 2.50    Types: Cigarettes  . Smokeless tobacco: Never Used  Substance Use Topics  . Alcohol use: No  . Drug use: Yes  Frequency: 1.0 times per week    Types: Marijuana     Allergies   Patient has no known allergies.   Review of Systems Review of Systems  Constitutional: Positive for fever.  HENT: Positive for congestion, sneezing and sore throat.   Respiratory: Positive for cough.   Cardiovascular: Negative.   Gastrointestinal: Positive for vomiting.  Musculoskeletal: Positive for myalgias.  Skin: Negative.   Neurological: Negative.   Psychiatric/Behavioral: Negative.   All other systems reviewed and are negative.    Physical Exam Updated Vital Signs BP 124/76   Pulse 100   Temp 99.5 F (37.5 C)    Resp 18   SpO2 100%   Physical Exam Vitals signs and nursing note reviewed.  Constitutional:      Appearance: She is well-developed. She is not toxic-appearing.  HENT:     Head: Normocephalic and atraumatic.     Right Ear: Tympanic membrane, ear canal and external ear normal.     Left Ear: Tympanic membrane, ear canal and external ear normal.     Nose:     Comments: Clear rhinorrhea.  Oropharynx reddened.  No exudate.  Uvula is midline Eyes:     Conjunctiva/sclera: Conjunctivae normal.     Pupils: Pupils are equal, round, and reactive to light.  Neck:     Musculoskeletal: Neck supple. No neck rigidity.     Thyroid: No thyromegaly.     Trachea: No tracheal deviation.  Cardiovascular:     Rate and Rhythm: Normal rate and regular rhythm.     Heart sounds: No murmur.  Pulmonary:     Effort: Pulmonary effort is normal.     Breath sounds: Normal breath sounds.  Abdominal:     General: Bowel sounds are normal. There is no distension.     Palpations: Abdomen is soft.     Tenderness: There is no abdominal tenderness.  Musculoskeletal: Normal range of motion.        General: No tenderness.  Skin:    General: Skin is warm and dry.     Findings: No rash.  Neurological:     Mental Status: She is alert.     Coordination: Coordination normal.      ED Treatments / Results  Labs (all labs ordered are listed, but only abnormal results are displayed) Labs Reviewed - No data to display  EKG None  Radiology No results found.  Procedures Procedures (including critical care time)  Medications Ordered in ED Medications  acetaminophen (TYLENOL) tablet 1,000 mg (has no administration in time range)     Initial Impression / Assessment and Plan / ED Course  I have reviewed the triage vital signs and the nursing notes.  Pertinent labs & imaging results that were available during my care of the patient were reviewed by me and considered in my medical decision making (see chart for  details).     Symptoms and exam consistent with influenza-like illness.  Plan encourage oral hydration.  Tylenol.  Follow-up with PCP if not improved by next week I counseled patient for 5 minutes on smoking cessation  Final Clinical Impressions(s) / ED Diagnoses   Final diagnoses:  Influenza-like illness   #2 tobacco abuse ED Discharge Orders    None       Doug Sou, MD 05/01/18 1444    Doug Sou, MD 05/01/18 1444

## 2018-06-25 ENCOUNTER — Telehealth: Payer: Self-pay | Admitting: Family Medicine

## 2018-06-25 MED ORDER — FLUTICASONE PROPIONATE 50 MCG/ACT NA SUSP: 2.0000 | Freq: Every day | NASAL | 6 refills | Status: DC

## 2018-06-25 NOTE — Telephone Encounter (Signed)
Work release placed up front for patient.

## 2018-06-25 NOTE — Telephone Encounter (Signed)
Refilled patient's Flonase for her allergic rhinitis and sent a note releasing her to return to work tomorrow, 06/26/2018.  Patient will come by the front desk to pick up her work release note.  Amanda C. Frances Furbish, MD PGY-2, [facility] 06/25/2018 10:10 AM

## 2018-07-27 ENCOUNTER — Other Ambulatory Visit: Payer: Self-pay

## 2018-07-27 ENCOUNTER — Telehealth: Payer: Self-pay

## 2018-07-27 ENCOUNTER — Telehealth (INDEPENDENT_AMBULATORY_CARE_PROVIDER_SITE_OTHER): Payer: Self-pay | Admitting: Family Medicine

## 2018-07-27 ENCOUNTER — Inpatient Hospital Stay (HOSPITAL_COMMUNITY)
Admission: AD | Admit: 2018-07-27 | Discharge: 2018-07-27 | Disposition: A | Discharge status: Home / Self Care | Payer: Medicaid Other | Attending: Obstetrics and Gynecology | Admitting: Obstetrics and Gynecology

## 2018-07-27 DIAGNOSIS — R109 Unspecified abdominal pain: Secondary | ICD-10-CM

## 2018-07-27 DIAGNOSIS — F17210 Nicotine dependence, cigarettes, uncomplicated: Secondary | ICD-10-CM | POA: Insufficient documentation

## 2018-07-27 DIAGNOSIS — N920 Excessive and frequent menstruation with regular cycle: Secondary | ICD-10-CM

## 2018-07-27 DIAGNOSIS — N939 Abnormal uterine and vaginal bleeding, unspecified: Secondary | ICD-10-CM | POA: Insufficient documentation

## 2018-07-27 DIAGNOSIS — N8500 Endometrial hyperplasia, unspecified: Secondary | ICD-10-CM

## 2018-07-27 LAB — POCT PREGNANCY, URINE: Preg Test, Ur: NEGATIVE

## 2018-07-27 NOTE — MAU Note (Signed)
Pt called office with heavy bleeding and abdominal pain. Not pregnant. Pt was told to do go ED, not MAU.

## 2018-07-27 NOTE — Telephone Encounter (Signed)
Pt called nurse line stating she can barely get out of bed due to "severe" pelvic pain. Pt stated she was recently diagnosed with endometriosis and rates her pain high this morning, pt is also having a heavy period. Pt has not tried any pain relief, I suggested Ibuprofen and the use of a heating pad she stated, "that doesn't work for me." I looked into her gyn referral and it looks like all the places you tried to send her do not except new patients with medicaid. Will also send to [facility] to look into this. Pt on telemed schedule this afternoon with Eniola.

## 2018-07-27 NOTE — MAU Note (Signed)
MSE done with MAU provider.Pt not pregnant advised to go to ED or follow up with provider.

## 2018-07-27 NOTE — Addendum Note (Signed)
Addended by: Janit Pagan T on: 07/27/2018 02:34 PM   Modules accepted: Orders

## 2018-07-27 NOTE — Discharge Instructions (Signed)
Abnormal Uterine Bleeding  Abnormal uterine bleeding means bleeding more than usual from your uterus. It can include:   Bleeding between periods.   Bleeding after sex.   Bleeding that is heavier than normal.   Periods that last longer than usual.   Bleeding after you have stopped having your period (menopause).  There are many problems that may cause this. You should see a doctor for any kind of bleeding that is not normal. Treatment depends on the cause of the bleeding.  Follow these instructions at home:   Watch your condition for any changes.   Do not use tampons, douche, or have sex, if your doctor tells you not to.   Change your pads often.   Get regular well-woman exams. Make sure they include a pelvic exam and cervical cancer screening.   Keep all follow-up visits as told by your doctor. This is important.  Contact a doctor if:   The bleeding lasts more than one week.   You feel dizzy at times.   You feel like you are going to throw up (nauseous).   You throw up.  Get help right away if:   You pass out.   You have to change pads every hour.   You have belly (abdominal) pain.   You have a fever.   You get sweaty.   You get weak.   You passing large blood clots from your vagina.  Summary   Abnormal uterine bleeding means bleeding more than usual from your uterus.   There are many problems that may cause this. You should see a doctor for any kind of bleeding that is not normal.   Treatment depends on the cause of the bleeding.  This information is not intended to replace advice given to you by your health care provider. Make sure you discuss any questions you have with your health care provider.  Document Released: 01/16/2009 Document Revised: 03/15/2016 Document Reviewed: 03/15/2016  Elsevier Interactive Patient Education  2019 [employer] Inc[employer]

## 2018-07-27 NOTE — Progress Notes (Signed)
I have contacted patient twice with no response. I will contact later one more time.  [facility] Telemedicine Visit  Patient consented to have virtual visit. Method of visit: Telephone  Encounter participants: Patient: Brandi Sanders - located at home Provider: Janit Pagan - located at office Others (if applicable): NA  Chief Complaint: Abdominal pain/Menorrhagia  HPI:  Abdominal Pain  This is a new problem. The current episode started in the past 7 days (Been having her period for 5 days associated with suprapubic pain). The onset quality is gradual. The problem occurs constantly. The problem has been gradually worsening. The pain is located in the suprapubic region. The pain is at a severity of 7/10. The pain is moderate. The quality of the pain is aching. Pain radiation: Radiates to her thighs. Pertinent negatives include no constipation, diarrhea, fever, nausea or vomiting. Exacerbated by: menses. The pain is relieved by nothing. Treatments tried: Ibuprofen 800 mg prn. The treatment provided moderate relief. There is no history of abdominal surgery.  Menstruation:LMP: 07/22/18, pretty heavy using 15-16 pads daily, with heavy clots, she feels weak and tired. She was not able to get up the bed this morning, but feels better now.   ROS: per HPI  Pertinent PMHx: Problem list reviewed  Exam:  Respiratory: Not in distress  Assessment/Plan: Menorrhagia plus abdominal pain. Hx of endometrial hyperplasia. Referral completed but she did not get a call from Gyn. I gave her the number for center for women for her to schedule appointment. Since she is symptomatic with heavy bleeding, I recommended ED visit to r/o acute anemia requiring blood transfusion. I initially advised her to go to the MAU, I called MAU and they said she should go to the main ED instead. I called and left a message for her regarding this. F/U with [place] after ED visit.   Time spent during  visit with patient: 25 minutes
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
## 2018-07-29 ENCOUNTER — Telehealth: Payer: Self-pay | Admitting: Family Medicine

## 2018-07-29 NOTE — Telephone Encounter (Signed)
Call to follow-up with the patient. She was not seen at the ED. Her bleeding has slowed down a bit, so she did not return. F/U appointment made for this Tuesday. She is advised to call Gyn to schedule outpatient visit. She verbalized understanding.

## 2018-07-31 ENCOUNTER — Ambulatory Visit: Payer: Medicaid Other

## 2018-09-14 ENCOUNTER — Encounter: Payer: Self-pay | Admitting: Emergency Medicine

## 2018-09-14 ENCOUNTER — Emergency Department
Admission: EM | Admit: 2018-09-14 | Discharge: 2018-09-14 | Disposition: A | Discharge status: Home / Self Care | Payer: Medicaid Other | Attending: Emergency Medicine | Admitting: Emergency Medicine

## 2018-09-14 DIAGNOSIS — F1910 Other psychoactive substance abuse, uncomplicated: Secondary | ICD-10-CM

## 2018-09-14 DIAGNOSIS — F17210 Nicotine dependence, cigarettes, uncomplicated: Secondary | ICD-10-CM | POA: Insufficient documentation

## 2018-09-14 DIAGNOSIS — F331 Major depressive disorder, recurrent, moderate: Secondary | ICD-10-CM

## 2018-09-14 DIAGNOSIS — F1014 Alcohol abuse with alcohol-induced mood disorder: Secondary | ICD-10-CM | POA: Diagnosis present

## 2018-09-14 DIAGNOSIS — F329 Major depressive disorder, single episode, unspecified: Secondary | ICD-10-CM | POA: Insufficient documentation

## 2018-09-14 DIAGNOSIS — I10 Essential (primary) hypertension: Secondary | ICD-10-CM | POA: Insufficient documentation

## 2018-09-14 DIAGNOSIS — F1410 Cocaine abuse, uncomplicated: Secondary | ICD-10-CM | POA: Insufficient documentation

## 2018-09-14 DIAGNOSIS — Z03818 Encounter for observation for suspected exposure to other biological agents ruled out: Secondary | ICD-10-CM | POA: Insufficient documentation

## 2018-09-14 HISTORY — DX: Alcohol abuse with alcohol-induced mood disorder: F10.14

## 2018-09-14 LAB — COMPREHENSIVE METABOLIC PANEL
ALT: 25 U/L (ref 0–44)
AST: 47 U/L — ABNORMAL HIGH (ref 15–41)
Albumin: 4.4 g/dL (ref 3.5–5.0)
Alkaline Phosphatase: 49 U/L (ref 38–126)
Anion gap: 10 (ref 5–15)
BUN: 8 mg/dL (ref 6–20)
CO2: 19 mmol/L — ABNORMAL LOW (ref 22–32)
Calcium: 8.7 mg/dL — ABNORMAL LOW (ref 8.9–10.3)
Chloride: 107 mmol/L (ref 98–111)
Creatinine, Ser: 0.78 mg/dL (ref 0.44–1.00)
GFR calc Af Amer: >60 mL/min (ref >60)
GFR calc non Af Amer: >60 mL/min (ref >60)
Glucose, Bld: 104 mg/dL — ABNORMAL HIGH (ref 70–99)
Potassium: 3.4 mmol/L — ABNORMAL LOW (ref 3.5–5.1)
Sodium: 136 mmol/L (ref 135–145)
Total Bilirubin: 0.5 mg/dL (ref 0.3–1.2)
Total Protein: 8.5 g/dL — ABNORMAL HIGH (ref 6.5–8.1)

## 2018-09-14 LAB — URINE DRUG SCREEN, QUALITATIVE (ARMC ONLY)
Amphetamines, Ur Screen: NOT DETECTED
Barbiturates, Ur Screen: NOT DETECTED
Benzodiazepine, Ur Scrn: NOT DETECTED
Cannabinoid 50 Ng, Ur ~~LOC~~: POSITIVE — AB
Cocaine Metabolite,Ur ~~LOC~~: POSITIVE — AB
MDMA (Ecstasy)Ur Screen: NOT DETECTED
Methadone Scn, Ur: NOT DETECTED
Opiate, Ur Screen: NOT DETECTED
Phencyclidine (PCP) Ur S: NOT DETECTED
Tricyclic, Ur Screen: NOT DETECTED

## 2018-09-14 LAB — CBC
HCT: 40.7 % (ref 36.0–46.0)
Hemoglobin: 13.9 g/dL (ref 12.0–15.0)
MCH: 31 pg (ref 26.0–34.0)
MCHC: 34.2 g/dL (ref 30.0–36.0)
MCV: 90.6 fL (ref 80.0–100.0)
Platelets: 193 10*3/uL (ref 150–400)
RBC: 4.49 MIL/uL (ref 3.87–5.11)
RDW: 14.7 % (ref 11.5–15.5)
WBC: 9.6 10*3/uL (ref 4.0–10.5)
nRBC: 0 % (ref 0.0–0.2)

## 2018-09-14 LAB — ACETAMINOPHEN LEVEL
Acetaminophen (Tylenol), Serum: <10 ug/mL — ABNORMAL LOW (ref 10–30)
Acetaminophen (Tylenol), Serum: <10 ug/mL — ABNORMAL LOW (ref 10–30)

## 2018-09-14 LAB — SALICYLATE LEVEL
Salicylate Lvl: <7 mg/dL (ref 2.8–30.0)
Salicylate Lvl: <7 mg/dL (ref 2.8–30.0)

## 2018-09-14 LAB — ETHANOL: Alcohol, Ethyl (B): 86 mg/dL — ABNORMAL HIGH (ref <10)

## 2018-09-14 LAB — POCT PREGNANCY, URINE: Preg Test, Ur: NEGATIVE

## 2018-09-14 LAB — SARS CORONAVIRUS 2 BY RT PCR (HOSPITAL ORDER, PERFORMED IN ~~LOC~~ HOSPITAL LAB): SARS Coronavirus 2: NEGATIVE

## 2018-09-14 NOTE — ED Notes (Signed)
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
## 2018-09-14 NOTE — ED Notes (Signed)
IVC  PENDING  PLACEMENT  PT  SEEN  BY NP
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
## 2018-09-14 NOTE — ED Notes (Signed)
TTS computer placed in room. 
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
## 2018-09-14 NOTE — ED Notes (Signed)
Patient lying in bed and appears to be asleep, NAD observed and will continue to monitor 

## 2018-09-14 NOTE — BH Assessment (Signed)
Pending review with [facility] ([phone]) 
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
## 2018-09-14 NOTE — ED Notes (Signed)
Spoke with patients mother, she called to get an update on patients status, informed her we will have patient give her a call once she wakes up

## 2018-09-14 NOTE — ED Notes (Signed)
All belongings returned to patient (1 belongings bag total).

## 2018-09-14 NOTE — ED Provider Notes (Signed)
-----------------------------------------   5:15 PM on 09/14/2018 ----------------------------------------- Case discussed with the psychiatry nurse practitioner who notes that she currently denies any acute psychiatric symptoms.  She obtained collateral information was from the patient spouse who also has no acute safety concerns.  Does not appear to be any intentional self-harm attempt, and patient is stable for outpatient follow-up.  They have reversed the IVC.  Vital signs are normal, she is medically stable.   Carrie Mew, MD 09/14/18 [phone]
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
## 2018-10-04 ENCOUNTER — Encounter: Payer: Medicaid Other | Admitting: Obstetrics & Gynecology

## 2018-10-04 NOTE — Progress Notes (Deleted)
   Patient did not show up today for her scheduled appointment.   Chayden Garrelts, MD, FACOG Obstetrician & Gynecologist, [facility], [facility]  

## 2018-10-18 ENCOUNTER — Other Ambulatory Visit (HOSPITAL_COMMUNITY)
Admission: RE | Admit: 2018-10-18 | Discharge: 2018-10-18 | Disposition: A | Discharge status: Home / Self Care | Payer: Medicaid Other | Source: Ambulatory Visit | Attending: Obstetrics & Gynecology | Admitting: Obstetrics & Gynecology

## 2018-10-18 ENCOUNTER — Encounter: Payer: Self-pay | Admitting: Obstetrics & Gynecology

## 2018-10-18 ENCOUNTER — Ambulatory Visit (INDEPENDENT_AMBULATORY_CARE_PROVIDER_SITE_OTHER): Payer: Medicaid Other | Admitting: Obstetrics & Gynecology

## 2018-10-18 ENCOUNTER — Other Ambulatory Visit: Payer: Self-pay

## 2018-10-18 VITALS — BP 118/80 | HR 72 | Wt 177.2 lb

## 2018-10-18 DIAGNOSIS — Z0000 Encounter for general adult medical examination without abnormal findings: Secondary | ICD-10-CM | POA: Diagnosis not present

## 2018-10-18 DIAGNOSIS — Z01419 Encounter for gynecological examination (general) (routine) without abnormal findings: Secondary | ICD-10-CM | POA: Diagnosis not present

## 2018-10-18 DIAGNOSIS — Z3009 Encounter for other general counseling and advice on contraception: Secondary | ICD-10-CM

## 2018-10-18 NOTE — Patient Instructions (Signed)
Contraception Choices Contraception, also called birth control, refers to methods or devices that prevent pregnancy. Hormonal methods Contraceptive implant  A contraceptive implant is a thin, plastic tube that contains a hormone. It is inserted into the upper part of the arm. It can remain in place for up to 3 years. Progestin-only injections Progestin-only injections are injections of progestin, a synthetic form of the hormone progesterone. They are given every 3 months by a health care provider. Birth control pills  Birth control pills are pills that contain hormones that prevent pregnancy. They must be taken once a day, preferably at the same time each day. Birth control patch  The birth control patch contains hormones that prevent pregnancy. It is placed on the skin and must be changed once a week for three weeks and removed on the fourth week. A prescription is needed to use this method of contraception. Vaginal ring  A vaginal ring contains hormones that prevent pregnancy. It is placed in the vagina for three weeks and removed on the fourth week. After that, the process is repeated with a new ring. A prescription is needed to use this method of contraception. Emergency contraceptive Emergency contraceptives prevent pregnancy after unprotected sex. They come in pill form and can be taken up to 5 days after sex. They work best the sooner they are taken after having sex. Most emergency contraceptives are available without a prescription. This method should not be used as your only form of birth control. Barrier methods Female condom  A female condom is a thin sheath that is worn over the penis during sex. Condoms keep sperm from going inside a woman's body. They can be used with a spermicide to increase their effectiveness. They should be disposed after a single use. Female condom  A female condom is a soft, loose-fitting sheath that is put into the vagina before sex. The condom keeps  sperm from going inside a woman's body. They should be disposed after a single use. Diaphragm  A diaphragm is a soft, dome-shaped barrier. It is inserted into the vagina before sex, along with a spermicide. The diaphragm blocks sperm from entering the uterus, and the spermicide kills sperm. A diaphragm should be left in the vagina for 6-8 hours after sex and removed within 24 hours. A diaphragm is prescribed and fitted by a health care provider. A diaphragm should be replaced every 1-2 years, after giving birth, after gaining more than 15 lb (6.8 kg), and after pelvic surgery. Cervical cap  A cervical cap is a round, soft latex or plastic cup that fits over the cervix. It is inserted into the vagina before sex, along with spermicide. It blocks sperm from entering the uterus. The cap should be left in place for 6-8 hours after sex and removed within 48 hours. A cervical cap must be prescribed and fitted by a health care provider. It should be replaced every 2 years. Sponge  A sponge is a soft, circular piece of polyurethane foam with spermicide on it. The sponge helps block sperm from entering the uterus, and the spermicide kills sperm. To use it, you make it wet and then insert it into the vagina. It should be inserted before sex, left in for at least 6 hours after sex, and removed and thrown away within 30 hours. Spermicides Spermicides are chemicals that kill or block sperm from entering the cervix and uterus. They can come as a cream, jelly, suppository, foam, or tablet. A spermicide should be inserted into  the vagina with an applicator at least 93-57 minutes before sex to allow time for it to work. The process must be repeated every time you have sex. Spermicides do not require a prescription. Intrauterine contraception Intrauterine device (IUD) An IUD is a T-shaped device that is put in a woman's uterus. There are two types:  Hormone IUD.This type contains progestin, a synthetic form of the  hormone progesterone. This type can stay in place for 3-5 years.  Copper IUD.This type is wrapped in copper wire. It can stay in place for 10 years.  Permanent methods of contraception Female tubal ligation In this method, a woman's fallopian tubes are sealed, tied, or blocked during surgery to prevent eggs from traveling to the uterus. Hysteroscopic sterilization In this method, a small, flexible insert is placed into each fallopian tube. The inserts cause scar tissue to form in the fallopian tubes and block them, so sperm cannot reach an egg. The procedure takes about 3 months to be effective. Another form of birth control must be used during those 3 months. Female sterilization This is a procedure to tie off the tubes that carry sperm (vasectomy). After the procedure, the man can still ejaculate fluid (semen). Natural planning methods Natural family planning In this method, a couple does not have sex on days when the woman could become pregnant. Calendar method This means keeping track of the length of each menstrual cycle, identifying the days when pregnancy can happen, and not having sex on those days. Ovulation method In this method, a couple avoids sex during ovulation. Symptothermal method This method involves not having sex during ovulation. The woman typically checks for ovulation by watching changes in her temperature and in the consistency of cervical mucus. Post-ovulation method In this method, a couple waits to have sex until after ovulation. Summary  Contraception, also called birth control, means methods or devices that prevent pregnancy.  Hormonal methods of contraception include implants, injections, pills, patches, vaginal rings, and emergency contraceptives.  Barrier methods of contraception can include female condoms, female condoms, diaphragms, cervical caps, sponges, and spermicides.  There are two types of IUDs (intrauterine devices). An IUD can be put in a woman's  uterus to prevent pregnancy for 3-5 years.  Permanent sterilization can be done through a procedure for males, females, or both.  Natural family planning methods involve not having sex on days when the woman could become pregnant. This information is not intended to replace advice given to you by your health care provider. Make sure you discuss any questions you have with your health care provider. Document Released: 03/21/2005 Document Revised: 03/23/2017 Document Reviewed: 04/23/2016 Elsevier Patient Education  2020 Brandi Sanders 34-47 Years Old, Female Preventive care refers to visits with your health care provider and lifestyle choices that can promote health and wellness. This includes:  A yearly physical exam. This may also be called an annual well check.  Regular dental visits and eye exams.  Immunizations.  Screening for certain conditions.  Healthy lifestyle choices, such as eating a healthy diet, getting regular exercise, not using drugs or products that contain nicotine and tobacco, and limiting alcohol use. What can I expect for my preventive care visit? Physical exam Your health care provider will check your:  Height and weight. This may be used to calculate body mass index (BMI), which tells if you are at a healthy weight.  Heart rate and blood pressure.  Skin for abnormal spots. Counseling Your health care provider may ask  you questions about your:  Alcohol, tobacco, and drug use.  Emotional well-being.  Home and relationship well-being.  Sexual activity.  Eating habits.  Work and work Statistician.  Method of birth control.  Menstrual cycle.  Pregnancy history. What immunizations do I need?  Influenza (flu) vaccine  This is recommended every year. Tetanus, diphtheria, and pertussis (Tdap) vaccine  You may need a Td booster every 10 years. Varicella (chickenpox) vaccine  You may need this if you have not been vaccinated.  Human papillomavirus (HPV) vaccine  If recommended by your health care provider, you may need three doses over 6 months. Measles, mumps, and rubella (MMR) vaccine  You may need at least one dose of MMR. You may also need a second dose. Meningococcal conjugate (MenACWY) vaccine  One dose is recommended if you are age 63-21 years and a first-year college student living in a residence hall, or if you have one of several medical conditions. You may also need additional booster doses. Pneumococcal conjugate (PCV13) vaccine  You may need this if you have certain conditions and were not previously vaccinated. Pneumococcal polysaccharide (PPSV23) vaccine  You may need one or two doses if you smoke cigarettes or if you have certain conditions. Hepatitis A vaccine  You may need this if you have certain conditions or if you travel or work in places where you may be exposed to hepatitis A. Hepatitis B vaccine  You may need this if you have certain conditions or if you travel or work in places where you may be exposed to hepatitis B. Haemophilus influenzae type b (Hib) vaccine  You may need this if you have certain conditions. You may receive vaccines as individual doses or as more than one vaccine together in one shot (combination vaccines). Talk with your health care provider about the risks and benefits of combination vaccines. What tests do I need?  Blood tests  Lipid and cholesterol levels. These may be checked every 5 years starting at age 71.  Hepatitis C test.  Hepatitis B test. Screening  Diabetes screening. This is done by checking your blood sugar (glucose) after you have not eaten for a while (fasting).  Sexually transmitted disease (STD) testing.  BRCA-related cancer screening. This may be done if you have a family history of breast, ovarian, tubal, or peritoneal cancers.  Pelvic exam and Pap test. This may be done every 3 years starting at age 56. Starting at age 21, this  may be done every 5 years if you have a Pap test in combination with an HPV test. Talk with your health care provider about your test results, treatment options, and if necessary, the need for more tests. Follow these instructions at home: Eating and drinking   Eat a diet that includes fresh fruits and vegetables, whole grains, lean protein, and low-fat dairy.  Take vitamin and mineral supplements as recommended by your health care provider.  Do not drink alcohol if: ? Your health care provider tells you not to drink. ? You are pregnant, may be pregnant, or are planning to become pregnant.  If you drink alcohol: ? Limit how much you have to 0-1 drink a day. ? Be aware of how much alcohol is in your drink. In the [place] one drink equals one 12 oz bottle of beer (355 mL), one 5 oz glass of wine (148 mL), or one 1 oz glass of hard liquor (44 mL). Lifestyle  Take daily care of your teeth and gums.  Stay active.  Exercise for at least 30 minutes on 5 or more days each week.  Do not use any products that contain nicotine or tobacco, such as cigarettes, e-cigarettes, and chewing tobacco. If you need help quitting, ask your health care provider.  If you are sexually active, practice safe sex. Use a condom or other form of birth control (contraception) in order to prevent pregnancy and STIs (sexually transmitted infections). If you plan to become pregnant, see your health care provider for a preconception visit. What's next?  Visit your health care provider once a year for a well check visit.  Ask your health care provider how often you should have your eyes and teeth checked.  Stay up to date on all vaccines. This information is not intended to replace advice given to you by your health care provider. Make sure you discuss any questions you have with your health care provider. Document Released: 05/17/2001 Document Revised: 11/30/2017 Document Reviewed: 11/30/2017 Elsevier Patient Education   2020 [employer].
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
## 2018-10-19 LAB — HEPATITIS C ANTIBODY: Hep C Virus Ab: <0.1 s/co ratio (ref 0.0–0.9)

## 2018-10-19 LAB — RPR: RPR Ser Ql: NONREACTIVE

## 2018-10-19 LAB — HIV ANTIBODY (ROUTINE TESTING W REFLEX): HIV Screen 4th Generation wRfx: NONREACTIVE

## 2018-10-19 LAB — HEPATITIS B SURFACE ANTIGEN: Hepatitis B Surface Ag: NEGATIVE

## 2018-10-24 LAB — CYTOLOGY - PAP
Chlamydia: NEGATIVE
Diagnosis: NEGATIVE
HPV: NOT DETECTED
Neisseria Gonorrhea: NEGATIVE
Trichomonas: NEGATIVE

## 2018-12-06 IMAGING — US US TRANSVAGINAL NON-OB
1 series · 15 of 25 positions shown · non-contrast
Comparison: None.

CLINICAL DATA: Abnormal uterine bleeding.  LMP 01/20/2018.

EXAM:
ULTRASOUND PELVIS TRANSVAGINAL
TECHNIQUE: Transvaginal ultrasound examination of the pelvis was performed
including evaluation of the uterus, ovaries, adnexal regions, and
pelvic cul-de-sac.

[Series 1: us transvaginal non-ob · 15 of 75 slices shown]
[im 1/75]
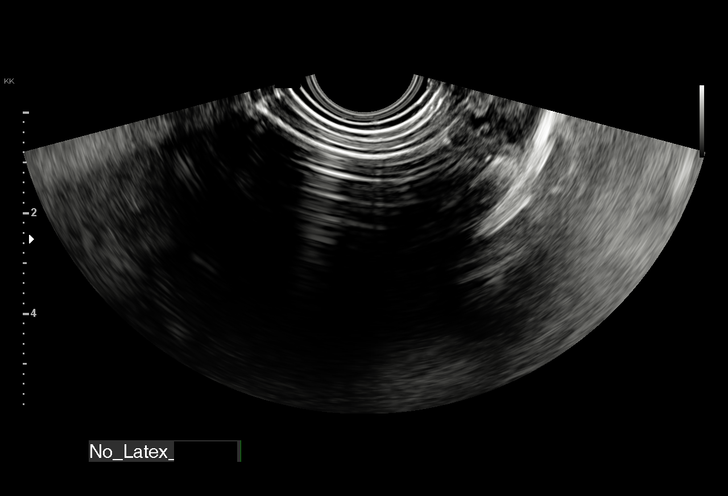
[im 7/75]
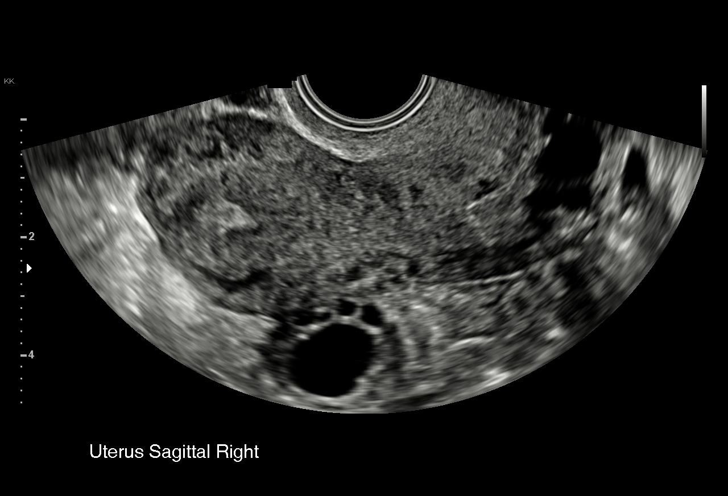
[im 13/75]
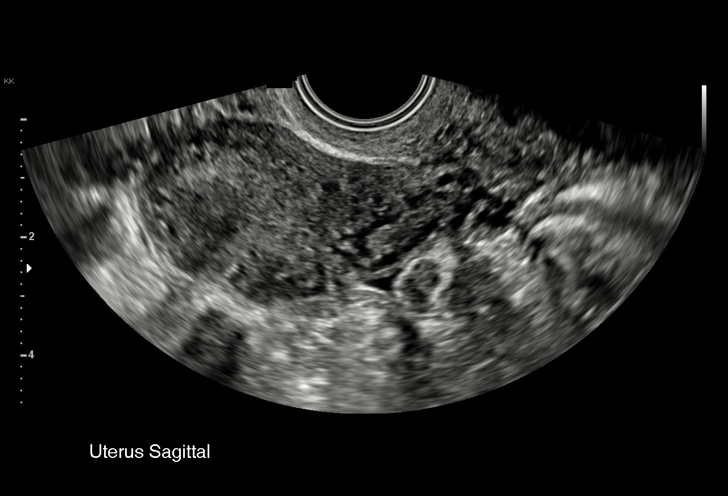
[im 16/75]
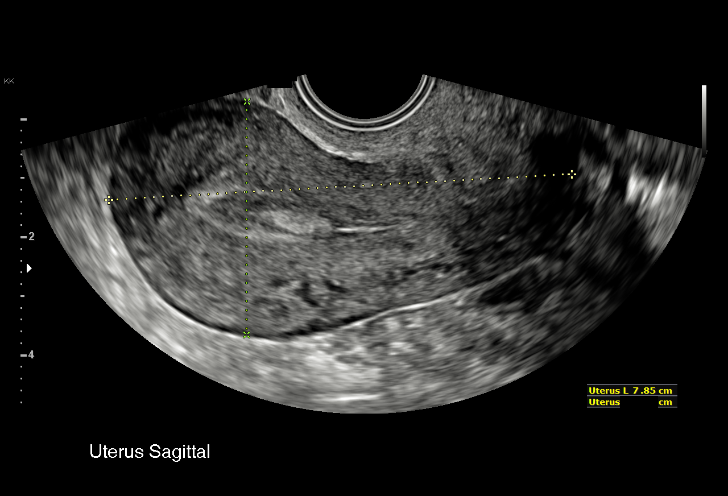
[im 22/75]
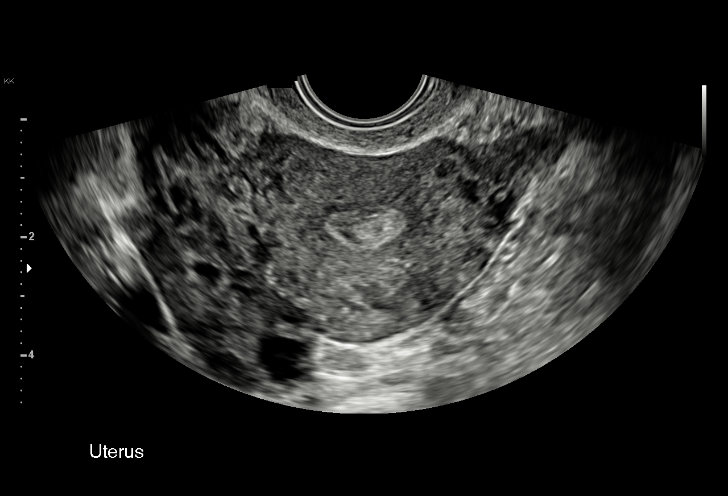
[im 28/75]
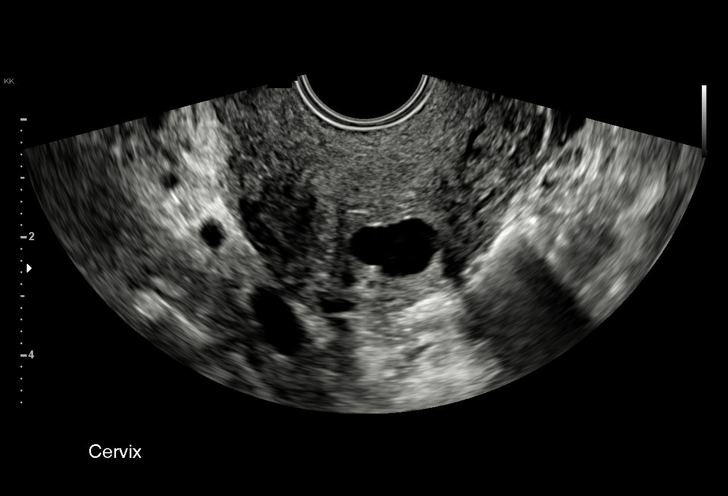
[im 31/75]
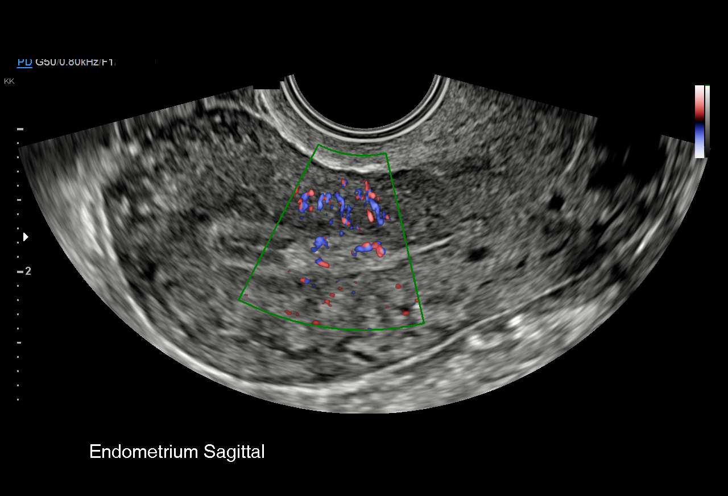
[im 38/75]
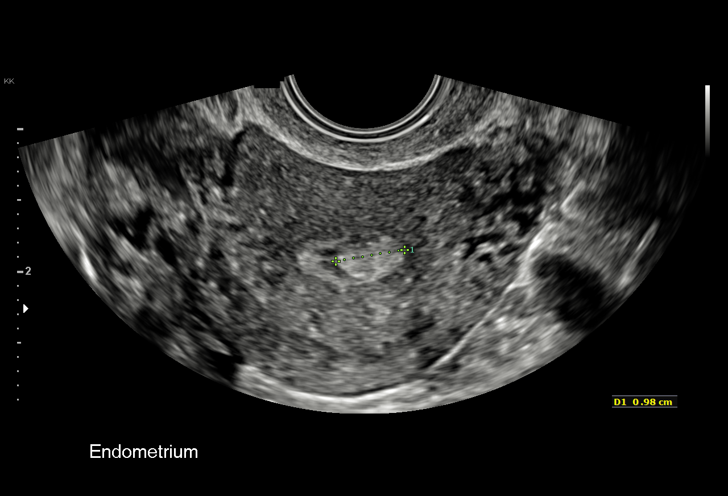
[im 44/75]
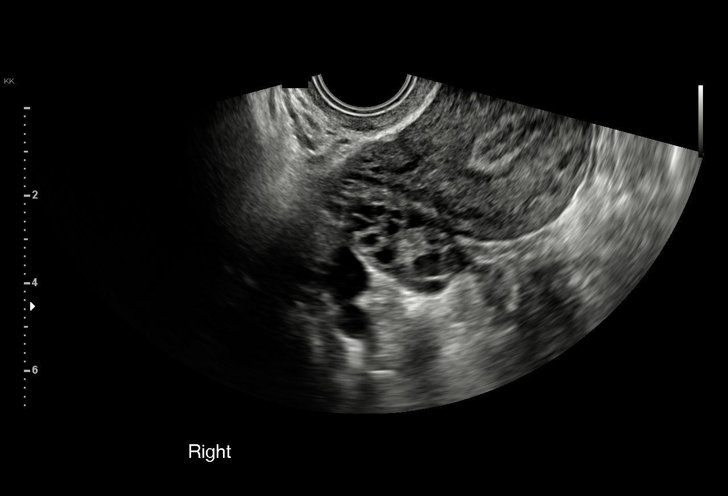
[im 47/75]
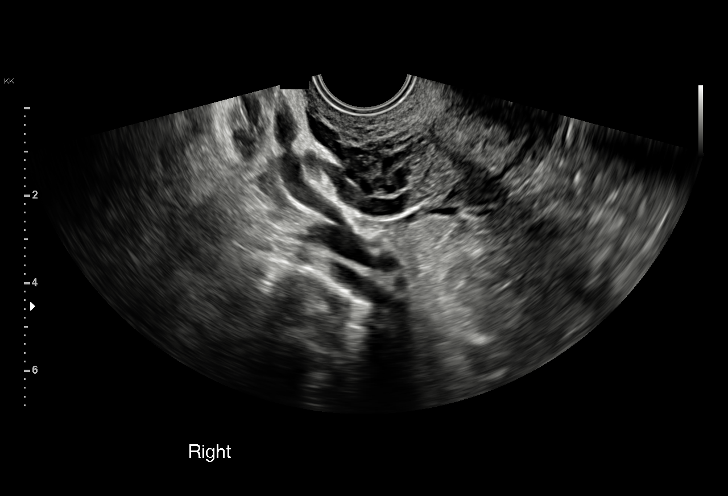
[im 53/75]
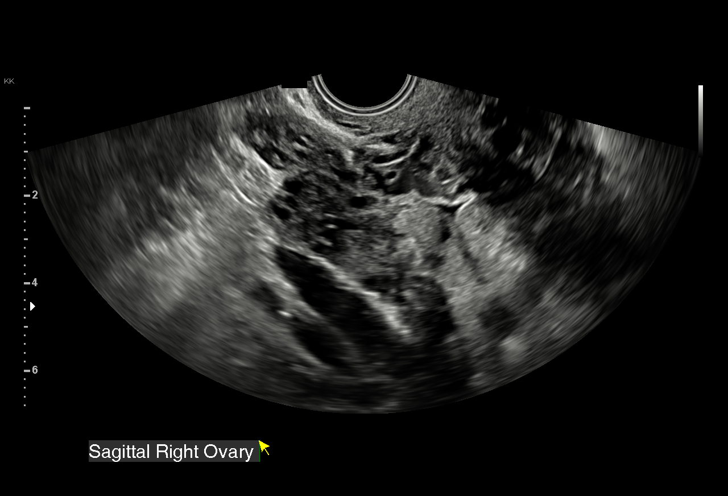
[im 59/75]
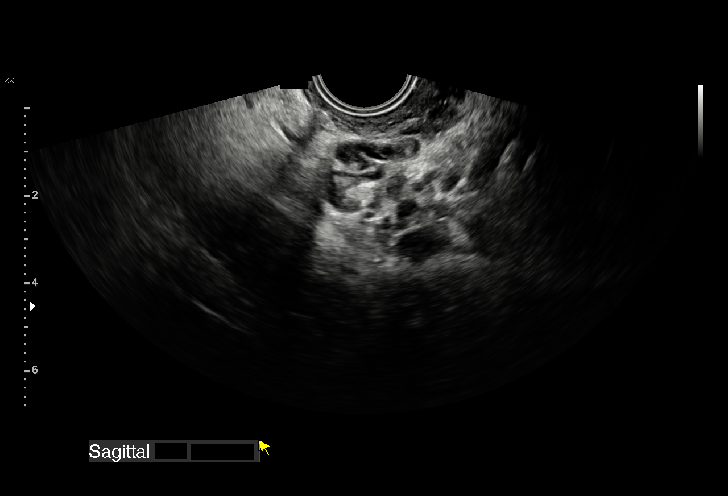
[im 62/75]
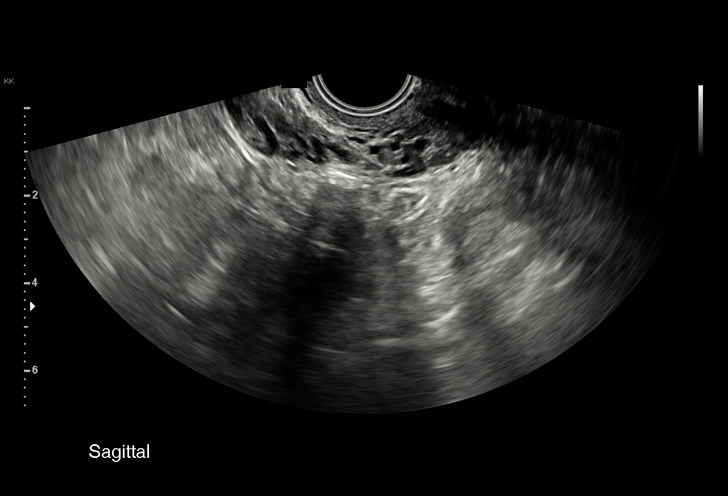
[im 68/75]
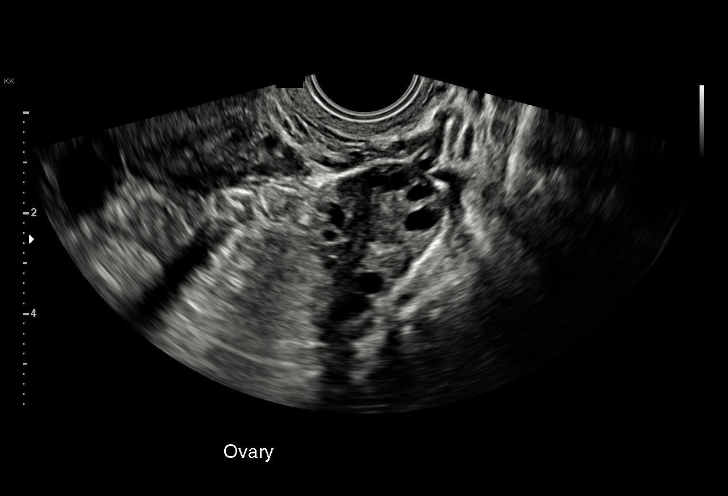
[im 75/75]
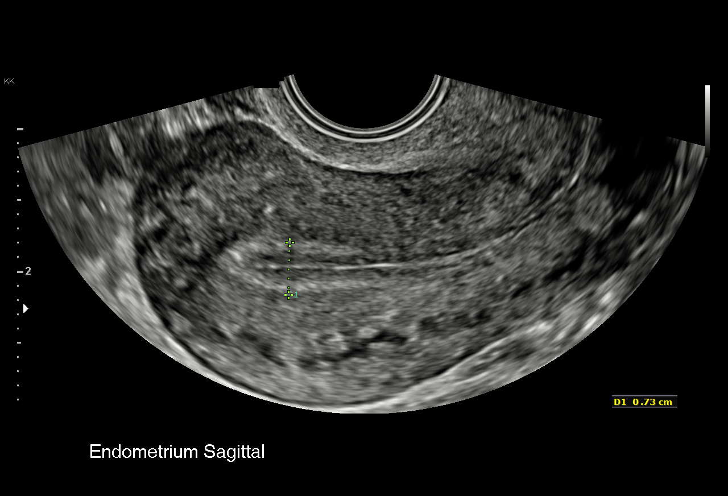

[15 of 25 positions shown; findings below may reference images not displayed]

FINDINGS: Uterus

Measurements: 7.9 x 4.0 x 4.9 cm = volume: 79 mL. No fibroids
identified.

Endometrium

Thickness: 8 mm. A focal hyperechoic polyp is seen in the midportion
of the endometrial cavity which measures 1.5 x 0.5 x 1.0 cm.

Right ovary

Measurements: 4.2 x 1.9 x 2.7 cm = volume: 11.4 mL. Normal
appearance/no adnexal mass.

Left ovary

Measurements: 3.5 x 2.6 x 2.4 cm = volume: 11.0 mL. Normal
appearance/no adnexal mass.

Other findings:  No abnormal free fluid
IMPRESSION: 1.5 cm polyp in the midportion of the endometrial cavity. Consider
hysteroscopy for resection.

No fibroids identified.

Normal appearance of both ovaries.

## 2019-01-19 ENCOUNTER — Emergency Department (HOSPITAL_COMMUNITY)
Admission: EM | Admit: 2019-01-19 | Discharge: 2019-01-19 | Disposition: A | Discharge status: Home / Self Care | Payer: Medicaid Other | Attending: Emergency Medicine | Admitting: Emergency Medicine

## 2019-01-19 ENCOUNTER — Emergency Department (HOSPITAL_COMMUNITY): Payer: Medicaid Other

## 2019-01-19 ENCOUNTER — Encounter (HOSPITAL_COMMUNITY): Payer: Self-pay | Admitting: Emergency Medicine

## 2019-01-19 ENCOUNTER — Other Ambulatory Visit: Payer: Self-pay

## 2019-01-19 DIAGNOSIS — J069 Acute upper respiratory infection, unspecified: Secondary | ICD-10-CM | POA: Insufficient documentation

## 2019-01-19 DIAGNOSIS — Z20828 Contact with and (suspected) exposure to other viral communicable diseases: Secondary | ICD-10-CM | POA: Insufficient documentation

## 2019-01-19 DIAGNOSIS — I10 Essential (primary) hypertension: Secondary | ICD-10-CM | POA: Insufficient documentation

## 2019-01-19 DIAGNOSIS — F17210 Nicotine dependence, cigarettes, uncomplicated: Secondary | ICD-10-CM | POA: Insufficient documentation

## 2019-01-19 MED ORDER — ACETAMINOPHEN 325 MG PO TABS
650.0000 mg | ORAL_TABLET | Freq: Once | ORAL | Status: AC
  Administered 2019-01-19: 16:00:00 650 mg via ORAL
  Filled 2019-01-19: qty 2

## 2019-01-19 NOTE — ED Provider Notes (Signed)
  Physical Exam  BP 126/85   Pulse (!) 103   Temp 100.1 F (37.8 C)   Resp 18   Ht 5\' 4"  (1.626 m)   Wt 75.8 kg   LMP 01/11/2019   SpO2 98%   BMI 28.67 kg/m   Physical Exam Vitals signs and nursing note reviewed.  Constitutional:      General: She is not in acute distress.    Appearance: She is well-developed. She is not diaphoretic.  HENT:     Head: Normocephalic and atraumatic.  Eyes:     General: No scleral icterus.    Conjunctiva/sclera: Conjunctivae normal.  Neck:     Musculoskeletal: Normal range of motion.  Pulmonary:     Effort: Pulmonary effort is normal. No respiratory distress.  Skin:    Findings: No rash.  Neurological:     Mental Status: She is alert.     ED Course/Procedures   Clinical Course as of Jan 18 1829  Sat Jan 19, 9064  158 33 year old female presents with complaint of URI symptoms x3 days, symptoms concerning for COVID-19.  Patient is generally well-appearing, lung sounds are clear.  She is tachycardic with a heart rate of 122, temp of 100.  Patient was given Tylenol for her fever, order of a portable chest x-ray and send out Covid testing.  Plan is to monitor check for chest x-ray result and improvement in her heart rate after administration of Tylenol.  If patient continues to do well plan for discharge.  Discussed likely Covid, recommend quarantine and OTC medications.    [LM]    Clinical Course User Index [LM] Tacy Learn, PA-C    Procedures  MDM  Care handed off from previous provider PA [facility].  Please see their note for further detail.  Briefly, patient is a 33 year old female presenting to the ED with cough, congestion, runny nose, body aches, frontal headache, loss of taste and smell for the past 3 days.  Patient vital signs showing tachycardia 122, temperature 100.  Given Tylenol for fever.  Plan is to obtain portable chest x-ray and send out cover testing.  She will likely be discharged home.  6:32 PM Chest x-ray is  unremarkable.  Patient's vital signs improved here with Tylenol.  Will have her follow-up with COVID testing and PCP.   Patient is hemodynamically stable, in NAD, and able to ambulate in the ED. Evaluation does not show pathology that would require ongoing emergent intervention or inpatient treatment. I explained the diagnosis to the patient. Pain has been managed and has no complaints prior to discharge. Patient is comfortable with above plan and is stable for discharge at this time. All questions were answered prior to disposition. Strict return precautions for returning to the ED were discussed. Encouraged follow up with PCP.   An After Visit Summary was printed and given to the patient.   Portions of this note were generated with [employer]. Dictation errors may occur despite best attempts at proofreading.      Delia Heady, PA-C 01/19/19 1832    Virgel Manifold, MD 01/19/19 [phone]

## 2019-01-19 NOTE — Discharge Instructions (Signed)
Quarantine at home.  Stay hydrated.  Take Tylenol as directed as needed for fever, headache, body aches.  If your symptoms are not improving with Tylenol alone, you may also take Motrin. Consider taking a vitamin C supplement, zinc supplement, vitamin D supplements.

## 2019-01-19 NOTE — ED Provider Notes (Signed)
[facility] EMERGENCY DEPARTMENT Provider Note   CSN: [id number] Arrival date & time: 01/19/19  1431     History   Chief Complaint Chief Complaint  Patient presents with  . Headache  . Fever  . Generalized Body Aches    HPI Brandi Sanders is a 33 y.o. female.     33yo female with no significant past medical history, daily smoker, presents with complaint of cough (occasionally productive with clear mucous), congestion, runny nose, body aches, headaches (frontal), loss of taste and smell x 3 days, fevers/chills/sweats. No known sick contacts. Patient took Tylenol last night and again this morning without improvement in her symptoms. No other complaints or concerns.  Brandi Sanders was evaluated in Emergency Department on 01/19/2019 for the symptoms described in the history of present illness. She was evaluated in the context of the global COVID-19 pandemic, which necessitated consideration that the patient might be at risk for infection with the SARS-CoV-2 virus that causes COVID-19. Institutional protocols and algorithms that pertain to the evaluation of patients at risk for COVID-19 are in a state of rapid change based on information released by regulatory bodies including the CDC and federal and state organizations. These policies and algorithms were followed during the patient's care in the ED.      Past Medical History:  Diagnosis Date  . Abdominal pain   . Depression     Patient Active Problem List   Diagnosis Date Noted  . Alcohol abuse with alcohol-induced mood disorder ([place]) 09/14/2018  . Moderate episode of recurrent major depressive disorder ([place])   . Abnormal uterine bleeding 02/15/2018  . Infertility, female 01/15/2018  . Exposure to STD 02/07/2017  . Vaginal discharge 07/04/2016  . Encounter for cervical Pap smear with pelvic exam 07/04/2016  . Essential hypertension, benign 01/21/2013  . Obesity 06/23/2011  . Unspecified episodic mood  disorder 11/03/2010  . HIDRADENITIS 03/25/2010  . ALLERGIC RHINITIS, SEASONAL 08/07/2006  . MIGRAINE, UNSPEC., W/O INTRACTABLE MIGRAINE 06/01/2006    Past Surgical History:  Procedure Laterality Date  . cold knife coniaztion  2007  . DILATION AND CURETTAGE OF UTERUS N/A 02/04/2013   Procedure: DILATATION AND CURETTAGE WITH IUD REMOVAL ;  Surgeon: Donnamae Jude, MD;  Location: [facility];  Service: Gynecology;  Laterality: N/A;  . UMBILICAL HERNIA REPAIR  2009  . vaginal deliveries  2006,2010     OB History    Gravida  2   Para  2   Term  2   Preterm      AB      Living        SAB      TAB      Ectopic      Multiple      Live Births  2            Home Medications    Prior to Admission medications   Not on File    Family History No family history on file.  Social History Social History   Tobacco Use  . Smoking status: Current Every Day Smoker    Packs/day: 0.25    Years: 10.00    Pack years: 2.50    Types: Cigarettes  . Smokeless tobacco: Never Used  Substance Use Topics  . Alcohol use: No  . Drug use: Yes    Frequency: 1.0 times per week    Types: Marijuana     Allergies   Patient has no known allergies.  Review of Systems Review of Systems  Constitutional: Positive for chills, diaphoresis and fever.  HENT: Positive for congestion and rhinorrhea. Negative for sore throat.   Respiratory: Positive for cough. Negative for shortness of breath.   Gastrointestinal: Positive for diarrhea. Negative for abdominal pain, constipation, nausea and vomiting.  Musculoskeletal: Positive for arthralgias and myalgias. Negative for neck pain and neck stiffness.  Skin: Negative for rash and wound.  Allergic/Immunologic: Negative for immunocompromised state.  Neurological: Positive for headaches. Negative for weakness.  Hematological: Negative for adenopathy. Does not bruise/bleed easily.  Psychiatric/Behavioral: Negative for confusion.  All other systems  reviewed and are negative.    Physical Exam Updated Vital Signs BP (!) 153/79 (BP Location: Right Arm)   Pulse (!) 122   Temp 100 F (37.8 C) (Oral)   Resp 18   Ht 5\' 4"  (1.626 m)   Wt 75.8 kg   LMP 01/11/2019   SpO2 99%   BMI 28.67 kg/m   Physical Exam Vitals signs and nursing note reviewed.  Constitutional:      General: She is not in acute distress.    Appearance: She is well-developed. She is not diaphoretic.  HENT:     Head: Normocephalic and atraumatic.     Right Ear: Tympanic membrane and ear canal normal.     Left Ear: Tympanic membrane and ear canal normal.  Neck:     Musculoskeletal: Normal range of motion and neck supple.  Cardiovascular:     Rate and Rhythm: Regular rhythm. Tachycardia present.     Heart sounds: Normal heart sounds. No murmur.  Pulmonary:     Effort: Pulmonary effort is normal.     Breath sounds: Normal breath sounds.  Skin:    General: Skin is warm and dry.     Findings: No erythema or rash.  Neurological:     Mental Status: She is alert and oriented to person, place, and time.  Psychiatric:        Behavior: Behavior normal.      ED Treatments / Results  Labs (all labs ordered are listed, but only abnormal results are displayed) Labs Reviewed  NOVEL CORONAVIRUS, NAA (HOSP ORDER, SEND-OUT TO REF LAB; TAT 18-24 HRS)    EKG None  Radiology No results found.  Procedures Procedures (including critical care time)  Medications Ordered in ED Medications  acetaminophen (TYLENOL) tablet 650 mg (650 mg Oral Given 01/19/19 1551)     Initial Impression / Assessment and Plan / ED Course  I have reviewed the triage vital signs and the nursing notes.  Pertinent labs & imaging results that were available during my care of the patient were reviewed by me and considered in my medical decision making (see chart for details).  Clinical Course as of Jan 19 1603  Sat Jan 19, 2019  [id number] 33 year old female presents with complaint of URI  symptoms x3 days, symptoms concerning for COVID-19.  Patient is generally well-appearing, lung sounds are clear.  She is tachycardic with a heart rate of 122, temp of 100.  Patient was given Tylenol for her fever, order of a portable chest x-ray and send out Covid testing.  Plan is to monitor check for chest x-ray result and improvement in her heart rate after administration of Tylenol.  If patient continues to do well plan for discharge.  Discussed likely Covid, recommend quarantine and OTC medications.    [LM]    Clinical Course User Index [LM] Jeannie FendMurphy, Laura A, PA-C  Final Clinical Impressions(s) / ED Diagnoses   Final diagnoses:  Viral upper respiratory tract infection    ED Discharge Orders    None       Jeannie Fend, PA-C 01/19/19 1604    [facility] Razor, MD 01/19/19 1845

## 2019-01-19 NOTE — ED Triage Notes (Signed)
Pt. Stated, Ive had body aches, fever and a headache for 2 days. Pt denies any COVID  Exposure.

## 2019-01-20 LAB — NOVEL CORONAVIRUS, NAA (HOSP ORDER, SEND-OUT TO REF LAB; TAT 18-24 HRS): SARS-CoV-2, NAA: NOT DETECTED

## 2019-09-05 ENCOUNTER — Encounter: Payer: Self-pay | Admitting: Radiology

## 2020-07-03 ENCOUNTER — Other Ambulatory Visit: Payer: Self-pay

## 2020-07-03 MED ORDER — FLUTICASONE PROPIONATE 50 MCG/ACT NA SUSP: 2.0000 | Freq: Every day | NASAL | 6 refills | Status: DC

## 2020-07-03 NOTE — Telephone Encounter (Signed)
Patient calls nurse line requesting a refill on Flonase. I advised patient she has not been seen in sometime and nothing is on her current medication list. I have scheduled her an apt with PCP for April. In the meantime can we send flonase to her pharmacy. Please advise.

## 2020-07-28 NOTE — Progress Notes (Signed)
Patient was a no-show to her appointment 07/29/2020.  Not sure what the patient was supposed to be following up with.  Contacted the patient at the number listed in her chart [phone], no answer, unable to leave voicemail.  Contacted patient's other number [phone], this went to a business without direct link to patient.  Patient has had 4+ no-shows in the past.  She has not been seen at our clinic since July 27, 2018 (telemedicine visit).  Patient recently called in April 2022 to ask for refill of her Flonase. 07/31/2018-no-show 05/01/2018-no-show 01/02/2018-canceled within 24 hours 11/17/2017-no-show 09/22/2016-left without being seen 09/20/2016-no-show  Certified letter will be sent to patient's home informing her of no-show policy and multiple no-show visits and that 3 no showed/late canceled appointments can lead to dismissal from our clinic.  Peggyann Shoals, DO [facility], PGY-3 07/29/2020 10:26 AM

## 2020-07-29 ENCOUNTER — Ambulatory Visit (INDEPENDENT_AMBULATORY_CARE_PROVIDER_SITE_OTHER): Payer: Medicaid Other | Admitting: Family Medicine

## 2020-07-29 DIAGNOSIS — Z91199 Patient's noncompliance with other medical treatment and regimen due to unspecified reason: Secondary | ICD-10-CM | POA: Insufficient documentation

## 2020-07-29 DIAGNOSIS — Z5329 Procedure and treatment not carried out because of patient's decision for other reasons: Secondary | ICD-10-CM | POA: Insufficient documentation

## 2020-07-29 HISTORY — DX: Patient's noncompliance with other medical treatment and regimen due to unspecified reason: Z91.199

## 2020-08-02 DIAGNOSIS — Z419 Encounter for procedure for purposes other than remedying health state, unspecified: Secondary | ICD-10-CM | POA: Diagnosis not present

## 2020-09-02 DIAGNOSIS — Z419 Encounter for procedure for purposes other than remedying health state, unspecified: Secondary | ICD-10-CM | POA: Diagnosis not present

## 2020-10-02 DIAGNOSIS — Z419 Encounter for procedure for purposes other than remedying health state, unspecified: Secondary | ICD-10-CM | POA: Diagnosis not present

## 2020-11-02 DIAGNOSIS — Z419 Encounter for procedure for purposes other than remedying health state, unspecified: Secondary | ICD-10-CM | POA: Diagnosis not present

## 2020-12-03 DIAGNOSIS — Z419 Encounter for procedure for purposes other than remedying health state, unspecified: Secondary | ICD-10-CM | POA: Diagnosis not present

## 2021-01-02 DIAGNOSIS — Z419 Encounter for procedure for purposes other than remedying health state, unspecified: Secondary | ICD-10-CM | POA: Diagnosis not present

## 2021-02-02 DIAGNOSIS — Z419 Encounter for procedure for purposes other than remedying health state, unspecified: Secondary | ICD-10-CM | POA: Diagnosis not present

## 2021-02-05 ENCOUNTER — Other Ambulatory Visit: Payer: Self-pay

## 2021-02-05 ENCOUNTER — Ambulatory Visit (INDEPENDENT_AMBULATORY_CARE_PROVIDER_SITE_OTHER): Payer: Medicaid Other | Admitting: Family Medicine

## 2021-02-05 ENCOUNTER — Encounter: Payer: Self-pay | Admitting: Family Medicine

## 2021-02-05 DIAGNOSIS — Z720 Tobacco use: Secondary | ICD-10-CM | POA: Diagnosis not present

## 2021-02-05 DIAGNOSIS — R030 Elevated blood-pressure reading, without diagnosis of hypertension: Secondary | ICD-10-CM

## 2021-02-05 HISTORY — DX: Elevated blood-pressure reading, without diagnosis of hypertension: R03.0

## 2021-02-05 NOTE — Assessment & Plan Note (Signed)
-  working on cutting down -continue to encourage tobacco cessation -consider chantix if appropriate

## 2021-02-05 NOTE — Progress Notes (Addendum)
    SUBJECTIVE:   CHIEF COMPLAINT / HPI:   Patient presents for annual check up. Denies any concerns at this time. Has had prior elevated blood pressures. Reports that she had this occur first when she was 35 years old and recommended by a provider at this time to start on a medication but she did not want to at the time. Now she is open to doing so if she needs it. Denies chest pain, dyspnea, leg swelling, vision changes and syncopal episodes. Tries to eat healthy, eats a balanced diet sometimes. Recently restarted exercising, she walks on a track and has a workout machine at home which she uses regularly. Current smoker but is working to cut down.    OBJECTIVE:   BP (!) 130/97   Pulse 85   Ht 5\' 4"  (1.626 m)   Wt 179 lb 6 oz (81.4 kg)   LMP 01/24/2021   SpO2 100%   BMI 30.79 kg/m   General: Patient well-appearing, in no acute distress. HEENT: non-tender thyroid, no evidence of cervical LAD CV: RRR, no murmurs or gallops auscultated Resp: CTAB Abdomen: soft, nontender, presence of bowel sounds Ext: radial pulses strong and equal bilaterally, no LE edema noted bilaterally Psych: mood appropriate   ASSESSMENT/PLAN:   Elevated blood pressure reading -BP 130/97 today, close to goal, has had multiple elevated readings in the past with a possible diagnosis of hypertension in the past -instructed patient to record BP 1-2 times daily and bring BP log to next visit -diet and exercise counseling provided -follow up in 2 weeks, consider starting amlodipine 2.5 mg if appropriate   Tobacco use -working on cutting down -continue to encourage tobacco cessation -consider chantix if appropriate    -Patient due for PAP smear July 2023.   August 2023, DO  [facility]

## 2021-02-05 NOTE — Assessment & Plan Note (Signed)
-  BP 130/97 today, close to goal, has had multiple elevated readings in the past with a possible diagnosis of hypertension in the past -instructed patient to record BP 1-2 times daily and bring BP log to next visit -diet and exercise counseling provided -follow up in 2 weeks, consider starting amlodipine 2.5 mg if appropriate

## 2021-02-05 NOTE — Patient Instructions (Addendum)
It was great seeing you today!  I am glad that you are doing well! Your blood pressure was 130/97, I am slightly concerned that this bottom number is a little more elevated that I would like. Please keep a record of your blood pressure daily and bring this record in to your next visit.   Please make sure to eat a balanced diet with plenty of vegetables along with exercising at least 3-4 times a week for 30-45 minutes.   You are not due for a PAP smear until July 2023, so please call when it gets closer to this time (about a month before) to schedule this.  Please follow up at your next scheduled appointment in 2 weeks, if anything arises between now and then, please don't hesitate to contact our office.   Thank you for allowing [place] to be a part of your medical care!  Thank you, Dr. Robyne Peers

## 2021-02-19 ENCOUNTER — Ambulatory Visit: Payer: Medicaid Other | Admitting: Family Medicine

## 2021-03-04 DIAGNOSIS — Z419 Encounter for procedure for purposes other than remedying health state, unspecified: Secondary | ICD-10-CM | POA: Diagnosis not present

## 2021-03-08 ENCOUNTER — Ambulatory Visit: Payer: Medicaid Other | Admitting: Family Medicine

## 2021-04-04 DIAGNOSIS — Z419 Encounter for procedure for purposes other than remedying health state, unspecified: Secondary | ICD-10-CM | POA: Diagnosis not present

## 2021-04-04 NOTE — L&D Delivery Note (Signed)
OB/GYN Faculty Practice Delivery Note  Brandi Sanders is a 36 y.o. G3P3003 s/p VD at [redacted]w[redacted]d. She was admitted for Iol cHTN SIPE w/o SF and A2GDM.   ROM: 5h 35m with clear fluid GBS Status:  Negative/-- (09/06 0933) Maximum Maternal Temperature: 98.48F  Labor Progress: Initial SVE: 3/60/-3. She then progressed to complete.   Delivery Date/Time: 12/15/21 0522 Delivery: Called to room and patient was complete and pushing. Head delivered LOA. No nuchal cord present. Shoulder and body delivered in usual fashion. Infant with spontaneous cry, placed on mother's abdomen, dried and stimulated. Cord clamped x 2 after 1-minute delay, and cut by FOB. Cord blood drawn. Placenta delivered spontaneously with gentle cord traction. Fundus firm with massage, lower uterine sweep and Pitocin. Small placental tissue extracted during LUS. Labia, perineum, vagina, and cervix inspected with no laceration noted.  Baby Weight: pending  Placenta: 3 vessel, intact. Sent to L&D Complications: None Lacerations: none EBL: 112 mL Analgesia: Epidural   Infant:  APGAR (1 MIN): 8   APGAR (5 MINS): 9    Myrtie Hawk, DO OB Family Medicine Fellow, [facility] for [employer], [facility] 12/15/2021, 5:46 AM

## 2021-05-05 DIAGNOSIS — Z419 Encounter for procedure for purposes other than remedying health state, unspecified: Secondary | ICD-10-CM | POA: Diagnosis not present

## 2021-05-25 ENCOUNTER — Encounter: Payer: Self-pay | Admitting: Family Medicine

## 2021-05-25 ENCOUNTER — Other Ambulatory Visit: Payer: Self-pay

## 2021-05-25 ENCOUNTER — Ambulatory Visit (INDEPENDENT_AMBULATORY_CARE_PROVIDER_SITE_OTHER): Payer: Medicaid Other | Admitting: Family Medicine

## 2021-05-25 VITALS — BP 137/91 | HR 102 | Wt 179.8 lb

## 2021-05-25 DIAGNOSIS — N926 Irregular menstruation, unspecified: Secondary | ICD-10-CM | POA: Diagnosis not present

## 2021-05-25 DIAGNOSIS — Z3201 Encounter for pregnancy test, result positive: Secondary | ICD-10-CM | POA: Diagnosis not present

## 2021-05-25 DIAGNOSIS — Z3A09 9 weeks gestation of pregnancy: Secondary | ICD-10-CM

## 2021-05-25 LAB — POCT URINE PREGNANCY: Preg Test, Ur: POSITIVE — AB

## 2021-05-25 NOTE — Patient Instructions (Signed)
It was great seeing you today!  Congratulations on your pregnancy!  We are getting your labs done, so that they will be ready for your first official OB visit.  As we discussed be sure to start your prenatal vitamin that will include at least 400 mcg of folate.  Continue to work on tobacco and marijuana cessation, and please let us know if you need any help with this.   Please check-out at the front desk before leaving the clinic to schedule your initial OB visit which will be an hour long, but if you need to be seen earlier than that for any new issues we're happy to fit you in, just give [place] a call!   Feel free to call with any questions or concerns at any time, at [phone].   Take care,  Dr. Cora Collum [facility]   Prenatal Care Prenatal care is health care during pregnancy. It helps you and your unborn baby (fetus) stay as healthy as possible. Prenatal care may be provided by a midwife, a family practice doctor, a Dispensing optician (nurse practitioner or physician assistant), or a childbirth and pregnancy doctor (obstetrician). How does this affect me? During pregnancy, you will be closely monitored for any new conditions that might develop. To lower your risk of pregnancy complications, you and your health care provider will talk about any underlying conditions you have. How does this affect my baby? Early and consistent prenatal care increases the chance that your baby will be healthy during pregnancy. Prenatal care lowers the risk that your baby will be: Born early (prematurely). Smaller than expected at birth (small for gestational age). What can I expect at the first prenatal care visit? Your first prenatal care visit will likely be the longest. You should schedule your first prenatal care visit as soon as you know that you are pregnant. Your first visit is a good time to talk about any questions or concerns you have about pregnancy. Medical  history At your visit, you and your health care provider will talk about your medical history, including: Any past pregnancies. Your family's medical history. Medical history of the baby's father. Any long-term (chronic) health conditions you have and how you manage them. Any surgeries or procedures you have had. Any current over-the-counter or prescription medicines, herbs, or supplements that you are taking. Other factors that could pose a risk to your baby, including: Exposure to harmful chemicals or radiation at work or at home. Any substance use, including tobacco, alcohol, and drug use. Your home setting and your stress levels, including: Exposure to abuse or violence. Household financial strain. Your daily health habits, including diet and exercise. Tests and screenings Your health care provider will: Measure your weight, height, and blood pressure. Do a physical exam, including a pelvic and breast exam. Perform blood tests and urine tests to check for: Urinary tract infection. Sexually transmitted infections (STIs). Low iron levels in your blood (anemia). Blood type and certain proteins on red blood cells (Rh antibodies). Infections and immunity to viruses, such as hepatitis B and rubella. HIV (human immunodeficiency virus). Discuss your options for genetic screening. Tips about staying healthy Your health care provider will also give you information about how to keep yourself and your baby healthy, including: Nutrition and taking vitamins. Physical activity. How to manage pregnancy symptoms such as nausea and vomiting (morning sickness). Infections and substances that may be harmful to your baby and how to avoid them. Food safety. Dental care. Working.  Travel. Warning signs to watch for and when to call your health care provider. How often will I have prenatal care visits? After your first prenatal care visit, you will have regular visits throughout your pregnancy. The  visit schedule is often as follows: Up to week 28 of pregnancy: once every 4 weeks. 28-36 weeks: once every 2 weeks. After 36 weeks: every week until delivery. Some women may have visits more or less often depending on any underlying health conditions and the health of the baby. Keep all follow-up and prenatal care visits. This is important. What happens during routine prenatal care visits? Your health care provider will: Measure your weight and blood pressure. Check for fetal heart sounds. Measure the height of your uterus in your abdomen (fundal height). This may be measured starting around week 20 of pregnancy. Check the position of your baby inside your uterus. Ask questions about your diet, sleeping patterns, and whether you can feel the baby move. Review warning signs to watch for and signs of labor. Ask about any pregnancy symptoms you are having and how you are dealing with them. Symptoms may include: Headaches. Nausea and vomiting. Vaginal discharge. Swelling. Fatigue. Constipation. Changes in your vision. Feeling persistently sad or anxious. Any discomfort, including back or pelvic pain. Bleeding or spotting. Make a list of questions to ask your health care provider at your routine visits. What tests might I have during prenatal care visits? You may have blood, urine, and imaging tests throughout your pregnancy, such as: Urine tests to check for glucose, protein, or signs of infection. Glucose tests to check for a form of diabetes that can develop during pregnancy (gestational diabetes mellitus). This is usually done around week 24 of pregnancy. Ultrasounds to check your baby's growth and development, to check for birth defects, and to check your baby's well-being. These can also help to decide when you should deliver your baby. A test to check for group B strep (GBS) infection. This is usually done around week 36 of pregnancy. Genetic testing. This may include blood, fluid,  or tissue sampling, or imaging tests, such as an ultrasound. Some genetic tests are done during the first trimester and some are done during the second trimester. What else can I expect during prenatal care visits? Your health care provider may recommend getting certain vaccines during pregnancy. These may include: A yearly flu shot (annual influenza vaccine). This is especially important if you will be pregnant during flu season. Tdap (tetanus, diphtheria, pertussis) vaccine. Getting this vaccine during pregnancy can protect your baby from whooping cough (pertussis) after birth. This vaccine may be recommended between weeks 27 and 36 of pregnancy. A COVID-19 vaccine. Later in your pregnancy, your health care provider may give you information about: Childbirth and breastfeeding classes. Choosing a health care provider for your baby. Umbilical cord banking. Breastfeeding. Birth control after your baby is born. The hospital labor and delivery unit and how to set up a tour. Registering at the hospital before you go into labor. Where to find more information Office on Women's Health: TravelLesson.ca American Pregnancy Association: americanpregnancy.org March of Dimes: marchofdimes.org Summary Prenatal care helps you and your baby stay as healthy as possible during pregnancy. Your first prenatal care visit will most likely be the longest. You will have visits and tests throughout your pregnancy to monitor your health and your baby's health. Bring a list of questions to your visits to ask your health care provider. Make sure to keep all follow-up and prenatal care  visits. This information is not intended to replace advice given to you by your health care provider. Make sure you discuss any questions you have with your health care provider. Document Revised: 01/02/2020 Document Reviewed: 01/02/2020 Elsevier Patient Education  2022 [employer].

## 2021-05-25 NOTE — Progress Notes (Signed)
° ° °  SUBJECTIVE:   CHIEF COMPLAINT / HPI:   Brandi Sanders is a 36 yo who presents after positive home pregnancy test.   Feels well, had a couple sick mornings but doing well otherwise. Denies bleeding, cramping LMP 12/20 which she is sure of. Was not taking contraception prior.   Desires the pregnancy. Has a 17 year old daughter, 33 year old son  Denies current alcohol use. Does smoke tobacco. Currently cut down to 3 cigarettes a day for the past week. Prior to that was smoking 8-10 cigarettes a day. States she is going to quit, quit while pregnant with the first 2. Does smoke marijuana but plans on stopping that as well.    OBJECTIVE:   BP (!) 137/91    Pulse (!) 102    Wt 179 lb 12.8 oz (81.6 kg)    LMP 03/22/2021 (Exact Date)    SpO2 100%    BMI 30.86 kg/m    Physical exam General: well appearing, NAD Cardiovascular: RRR, no murmurs Lungs: CTAB. Normal WOB Abdomen: soft, non-distended, non-tender Skin: warm, dry. No edema  ASSESSMENT/PLAN:   No problem-specific Assessment & Plan notes found for this encounter.   Positive pregnancy Confirmed in the clinic. Pregnancy wanted. Feeling well some occasional nausea, no bleeding or cramping. Not currently taking any medication but currently smoking cigarettes and marijuana. She cut down cigarettes, currently 3 a day and is confident she can quit on her own, but she knows we have resources to help her quit if she needs it. Discussed starting prenatal vitamin. Discussed ways to help minimize nausea with frequent snacking, and eating something first thing in the morning. Obtaining OB labs today and she will return for her initial OB visit. All questions answered.   Cora Collum, DO [facility]

## 2021-05-27 LAB — CBC/D/PLT+RPR+RH+ABO+RUBIGG...
Antibody Screen: NEGATIVE
Basophils Absolute: 0 10*3/uL (ref 0.0–0.2)
Basos: 1 %
Bilirubin, UA: NEGATIVE
EOS (ABSOLUTE): 0.3 10*3/uL (ref 0.0–0.4)
Eos: 4 %
Glucose, UA: NEGATIVE
HCV Ab: NONREACTIVE
HIV Screen 4th Generation wRfx: NONREACTIVE
Hematocrit: 33 % — ABNORMAL LOW (ref 34.0–46.6)
Hemoglobin: 11.4 g/dL (ref 11.1–15.9)
Hepatitis B Surface Ag: NEGATIVE
Immature Grans (Abs): 0 10*3/uL (ref 0.0–0.1)
Immature Granulocytes: 0 %
Ketones, UA: NEGATIVE
Leukocytes,UA: NEGATIVE
Lymphocytes Absolute: 2.2 10*3/uL (ref 0.7–3.1)
Lymphs: 34 %
MCH: 30.9 pg (ref 26.6–33.0)
MCHC: 34.5 g/dL (ref 31.5–35.7)
MCV: 89 fL (ref 79–97)
Monocytes Absolute: 0.5 10*3/uL (ref 0.1–0.9)
Monocytes: 8 %
Neutrophils Absolute: 3.3 10*3/uL (ref 1.4–7.0)
Neutrophils: 53 %
Nitrite, UA: NEGATIVE
Platelets: 251 10*3/uL (ref 150–450)
RBC, UA: NEGATIVE
RBC: 3.69 x10E6/uL — ABNORMAL LOW (ref 3.77–5.28)
RDW: 14.2 % (ref 11.7–15.4)
RPR Ser Ql: NONREACTIVE
Rh Factor: POSITIVE
Rubella Antibodies, IGG: 2.71 index (ref >0.99)
Specific Gravity, UA: >=1.03 — AB (ref 1.005–1.030)
Urobilinogen, Ur: 0.2 mg/dL (ref 0.2–1.0)
WBC: 6.3 10*3/uL (ref 3.4–10.8)
pH, UA: 6.5 (ref 5.0–7.5)

## 2021-05-27 LAB — HGB FRACTIONATION CASCADE
Hgb A2: 2.7 % (ref 1.8–3.2)
Hgb A: 97.3 % (ref 96.4–98.8)
Hgb F: 0 % (ref 0.0–2.0)
Hgb S: 0 %

## 2021-05-27 LAB — MICROSCOPIC EXAMINATION
Bacteria, UA: NONE SEEN
Casts: NONE SEEN /lpf
RBC, Urine: NONE SEEN /hpf (ref 0–2)
WBC, UA: NONE SEEN /hpf (ref 0–5)

## 2021-05-27 LAB — HCV INTERPRETATION

## 2021-05-27 LAB — URINE CULTURE, OB REFLEX

## 2021-05-29 ENCOUNTER — Encounter: Payer: Self-pay | Admitting: Family Medicine

## 2021-06-02 DIAGNOSIS — Z419 Encounter for procedure for purposes other than remedying health state, unspecified: Secondary | ICD-10-CM | POA: Diagnosis not present

## 2021-06-11 ENCOUNTER — Ambulatory Visit (INDEPENDENT_AMBULATORY_CARE_PROVIDER_SITE_OTHER): Payer: Medicaid Other | Admitting: Family Medicine

## 2021-06-11 ENCOUNTER — Other Ambulatory Visit: Payer: Self-pay

## 2021-06-11 ENCOUNTER — Encounter: Payer: Self-pay | Admitting: Family Medicine

## 2021-06-11 VITALS — BP 133/89 | HR 102 | Wt 182.0 lb

## 2021-06-11 DIAGNOSIS — I10 Essential (primary) hypertension: Secondary | ICD-10-CM

## 2021-06-11 DIAGNOSIS — O0991 Supervision of high risk pregnancy, unspecified, first trimester: Secondary | ICD-10-CM | POA: Insufficient documentation

## 2021-06-11 HISTORY — DX: Supervision of high risk pregnancy, unspecified, first trimester: O09.91

## 2021-06-11 MED ORDER — ASPIRIN EC 81 MG PO TBEC: 81.0000 mg | DELAYED_RELEASE_TABLET | Freq: Every day | ORAL | 11 refills | Status: DC

## 2021-06-11 NOTE — Progress Notes (Signed)
?Patient Name: Brandi Sanders ?Date of Birth: 02-Mar-1986 ?[facility] Initial Prenatal Visit ? ?Brandi Sanders is a 36 y.o. year old G3P2000 at Unknown who presents for her initial prenatal visit. ?Pregnancy is planned ?She reports positive home pregnancy test. ?She is taking a prenatal vitamin.  ?She denies pelvic pain or vaginal bleeding.  ? ?Pregnancy Dating: ?The patient is dated by LMP.  ?LMP: 03/23/2021 ?Period is certain:  Yes.  ?Periods were regular:  Yes.  ?LMP was a typical period:  Yes.  ?Using hormonal contraception in 3 months prior to conception: No ? ?Lab Review: ?Blood type: O ?Rh Status: Positive  ?Antibody screen: Negative ?HIV: Negative ?RPR: Negative ?Hemoglobin electrophoresis reviewed: Yes ?Results of OB urine culture are: Negative ?Rubella: Immune ?Hep C Ab: Negative ?Varicella status is Immune ? ?PMH: Reviewed and as detailed below: ?HTN: Yes, not on antihypertensive regimen  ?Gestational Hypertension/preeclampsia: No  ?Type 1 or 2 Diabetes: No  ?Depression:  No  ?Seizure disorder:  No ?VTE: No ,  ?History of STI Yes, very long ago was diagnosed with chlamydia.  ?Abnormal Pap smear:  Yes, with repeat within normal limits. ?Genital herpes simplex:  No  ? ?PSH: ?Gynecologic Surgery:  no ?Surgical history reviewed, notable for: hernia repair ? ?Obstetric History: ?Obstetric history tab updated and reviewed.  ?Summary of prior pregnancies: 3 ?Cesarean delivery: No  ?Gestational Diabetes:  No ?Hypertension in pregnancy: No ?History of preterm birth: No ?History of LGA/SGA infant:  No ?History of shoulder dystocia: No ?Indications for referral were reviewed, and the patient has no obstetric indications for referral to High Risk OB Clinic at this time.  ? ?Social History: ?Partner's name: Nena Polio   ?Tobacco use: No, quit recently  ?Alcohol use:  No ?Other substance use:  No ? ?Current Medications:  ?Prenatal vitamin   ?Reviewed and appropriate in pregnancy.  ? ?Genetic and  Infection Screen: ?Flow Sheet Updated Yes ? ?Prenatal Exam: ?Gen: Well nourished, well developed.  No distress.  Vitals noted. ?HEENT: Normocephalic, atraumatic.  Neck supple without cervical lymphadenopathy, thyromegaly or thyroid nodules.  Fair dentition. ?CV: RRR no murmur, gallops or rubs ?Lungs: CTAB.  Normal respiratory effort without wheezes or rales. ?Abd: soft, NTND. +BS.  Uterus not appreciated above pelvis. ?GU: Normal external female genitalia without lesions.  Nl vaginal, well rugated without lesions. No vaginal discharge.  Bimanual exam: No adnexal mass or TTP. No CMT.  Uterus size normal.  ?Ext: No clubbing, cyanosis or edema. ?Psych: Normal grooming and dress.  Not depressed or anxious appearing.  Normal thought content and process without flight of ideas or looseness of associations ? ?GU exam performed in the presence of chaperone.  ? ?Assessment/Plan: ? ?Brandi Sanders is a 36 y.o. G3P2000 at Unknown who presents to initiate prenatal care. She is doing well.  ?Current pregnancy issues include chronic hypertension. ? ?Routine prenatal care: ?As dating is reliable, a dating ultrasound has not been ordered. Dating tab updated. ?Pre-pregnancy weight updated. Expected weight gain this pregnancy is 11-20 lbs. ?Prenatal labs reviewed and unremarkable.  ?Indications for referral to HROB were reviewed and the patient does meet criteria for referral. Referral placed. ?Medication list reviewed and updated.  ?Recommended patient see a dentist for regular care.  ?Bleeding and pain precautions reviewed. ?Importance of prenatal vitamins reviewed.  ?Genetic screening offered. Patient politely declines genetic screening. ?The patient does have an indication for aspirin therapy beginning at 12-16 weeks. Aspirin was  recommended today to start at [redacted]  weeks gestation.  ?The patient will not age 70 or over at time of delivery. Referral to genetic counseling was not offered today.  ?The patient has risk factors for  preexisting diabetes, she will need a 1 hour glucola at her next OB visit.  ?Pregnancy Medical Home and PHQ-9 forms completed, problems noted: Yes ? ?2. Pregnancy issues include the following which were addressed today:  ?Chronic hypertension: BP 133/89, at goal but may need therapy as appropriate.  ?Patient up to date on PAP smear, pelvic exam performed without GC/Chlamydia testing as patient politely declined.  ?PHQ-9 score of 0 reviewed and discussed.  ? ?Due to patient's history of chronic hypertension, she needs criteria for high risk pregnancy. OB information given for scheduling, will be following up with OB for the remainder of her pregnancy. OB referral placed.  ? ? ? ?

## 2021-06-11 NOTE — Patient Instructions (Addendum)
It was great seeing you today! ? ?Congratulations on your pregnancy!  ? ?It is important that your blood pressure stays within normal range since it can cause pregnancy complications. Due to your history of high blood pressure, it would be the best care for you to follow with an OB for the remainder of your pregnancy. Please call [phone] to schedule your next appointment.  ? ?Please continue to take a prenatal vitamin daily. Please START taking aspirin 81 mg daily next week.  ? ?You will likely get a 1 hour glucose testing at your next OB appointment to test for preexisting diabetes and then another testing around 24-28 weeks to test if you have diabetes specifically during your pregnancy.  ? ?Go to the MAU at [facility] if: ?You have pain in your lower abdomen or pelvic area ?Your water breaks.  Sometimes it is a big gush of fluid, sometimes it is just a trickle that keeps getting your underwear wet or running down your legs ?You have vaginal bleeding.  ? ?I am still your primary doctor and would be happy to continue seeing you after the pregnancy along with baby! ? ?Please follow up at your next scheduled appointment, if anything arises between now and then, please don't hesitate to contact our office. ? ? ?Thank you for allowing [place] to be a part of your medical care! ? ?Thank you, ?Dr. Robyne Peers  ? ?

## 2021-06-21 ENCOUNTER — Ambulatory Visit (INDEPENDENT_AMBULATORY_CARE_PROVIDER_SITE_OTHER): Payer: Medicaid Other | Admitting: Obstetrics and Gynecology

## 2021-06-21 ENCOUNTER — Ambulatory Visit (INDEPENDENT_AMBULATORY_CARE_PROVIDER_SITE_OTHER): Payer: Medicaid Other | Admitting: Licensed Clinical Social Worker

## 2021-06-21 ENCOUNTER — Other Ambulatory Visit: Payer: Self-pay

## 2021-06-21 ENCOUNTER — Encounter: Payer: Self-pay | Admitting: Obstetrics and Gynecology

## 2021-06-21 ENCOUNTER — Other Ambulatory Visit (HOSPITAL_COMMUNITY)
Admission: RE | Admit: 2021-06-21 | Discharge: 2021-06-21 | Disposition: A | Discharge status: Home / Self Care | Payer: Medicaid Other | Source: Ambulatory Visit | Attending: Obstetrics and Gynecology | Admitting: Obstetrics and Gynecology

## 2021-06-21 VITALS — BP 136/85 | HR 94 | Wt 181.0 lb

## 2021-06-21 DIAGNOSIS — O0991 Supervision of high risk pregnancy, unspecified, first trimester: Secondary | ICD-10-CM | POA: Diagnosis not present

## 2021-06-21 DIAGNOSIS — O99210 Obesity complicating pregnancy, unspecified trimester: Secondary | ICD-10-CM

## 2021-06-21 DIAGNOSIS — O10919 Unspecified pre-existing hypertension complicating pregnancy, unspecified trimester: Secondary | ICD-10-CM

## 2021-06-21 DIAGNOSIS — Z3401 Encounter for supervision of normal first pregnancy, first trimester: Secondary | ICD-10-CM

## 2021-06-21 HISTORY — DX: Obesity complicating pregnancy, unspecified trimester: O99.210

## 2021-06-21 HISTORY — DX: Unspecified pre-existing hypertension complicating pregnancy, unspecified trimester: O10.919

## 2021-06-21 NOTE — Progress Notes (Signed)
New OB Intake ? ?I connected with  Brandi Sanders on 06/21/21 at  9:55 AM EDT by in person Visit and verified that I am speaking with the correct person using two identifiers. Nurse is located at [facility] and pt is located at [place]. ? ?I discussed the limitations, risks, security and privacy concerns of performing an evaluation and management service by telephone and the availability of in person appointments. I also discussed with the patient that there may be a patient responsible charge related to this service. The patient expressed understanding and agreed to proceed. ? ?I explained I am completing New OB Intake today. We discussed her EDD of 12/28/21 that is based on LMP of 03/23/21. Pt is G3/P2. I reviewed her allergies, medications, Medical/Surgical/OB history, and appropriate screenings. I informed her of [facility] services. Based on history, this is a/an  pregnancy complicated by hypertension .  ? ?Patient Active Problem List  ? Diagnosis Date Noted  ? Supervision of high risk pregnancy in first trimester 06/11/2021  ? Elevated blood pressure reading 02/05/2021  ? Tobacco use 02/05/2021  ? No-show for appointment 07/29/2020  ? Alcohol abuse with alcohol-induced mood disorder (HCC) 09/14/2018  ? Moderate episode of recurrent major depressive disorder (HCC)   ? Abnormal uterine bleeding 02/15/2018  ? Infertility, female 01/15/2018  ? Exposure to STD 02/07/2017  ? Vaginal discharge 07/04/2016  ? Encounter for cervical Pap smear with pelvic exam 07/04/2016  ? Essential hypertension, benign 01/21/2013  ? Obesity 06/23/2011  ? Unspecified episodic mood disorder 11/03/2010  ? HIDRADENITIS 03/25/2010  ? ALLERGIC RHINITIS, SEASONAL 08/07/2006  ? MIGRAINE, UNSPEC., W/O INTRACTABLE MIGRAINE 06/01/2006  ? ? ?Concerns addressed today ? ?Delivery Plans:  ?Plans to deliver at [facility].  ? ?MyChart/Babyscripts ?MyChart access verified. I explained pt will have some visits in office and some virtually. Babyscripts  instructions given and order placed. Patient verifies receipt of registration text/e-mail. Account successfully created and app downloaded. ? ?Blood Pressure Cuff  ?Has BP cuff already.Explained after first prenatal appt pt will check weekly and document in Babyscripts. ? ?Weight scale: Patient    have weight scale. Weight scale ordered  ? ?Anatomy US ?Explained first scheduled [place] will be around 19 weeks. Anatomy US scheduled for 19 wks at MFM. Pt notified to arrive at TBD. ? ?Labs ?Discussed Avelina Laine genetic screening with patient. Would like both Panorama and Horizon drawn at new OB visit. Routine prenatal labs needed. ? ?Covid Vaccine ?Patient has not covid vaccine.  ? ?Social Determinants of Health ?Food Insecurity: Patient denies food insecurity. ?WIC Referral: Patient is not interested in referral to [facility].  ?Transportation: Patient denies transportation needs. ?Childcare: Discussed no children allowed at ultrasound appointments. Offered childcare services; patient declines childcare services at this time. ? ?First visit review ?I reviewed new OB appt with pt. I explained she will have a pelvic exam, ob bloodwork with genetic screening, and PAP smear. Explained pt will be seen by Dr. Jolayne Panther at first visit; encounter routed to appropriate provider. Explained that patient will be seen by pregnancy navigator following visit with provider. [facility] information placed in AVS.  ? ?Harrel Lemon, RN ?06/21/2021  10:20 AM  ?

## 2021-06-21 NOTE — Addendum Note (Signed)
Addended by: Harrel Lemon on: 06/21/2021 11:42 AM ? ? Modules accepted: Orders ? ?

## 2021-06-21 NOTE — Progress Notes (Signed)
? ?  PRENATAL VISIT NOTE ? ?Subjective:  ?Brandi Sanders is a 36 y.o. G3P2002 at [redacted]w[redacted]d being seen today for ongoing prenatal care. Patient is transferring care from [facility] due to [facility]. She is currently monitored for the following issues for this high-risk pregnancy and has MIGRAINE, UNSPEC., W/O INTRACTABLE MIGRAINE; ALLERGIC RHINITIS, SEASONAL; HIDRADENITIS; Unspecified episodic mood disorder; Obesity; Essential hypertension, benign; Vaginal discharge; Encounter for cervical Pap smear with pelvic exam; Exposure to STD; Infertility, female; Abnormal uterine bleeding; Moderate episode of recurrent major depressive disorder ([place]); Alcohol abuse with alcohol-induced mood disorder ([place]); No-show for appointment; Elevated blood pressure reading; Tobacco use; Supervision of high risk pregnancy in first trimester; Chronic hypertension during pregnancy, antepartum; and Maternal obesity affecting pregnancy, antepartum on their problem list. ? ?Patient reports no complaints.  Contractions: Not present. Vag. Bleeding: None.   . Denies leaking of fluid.  ? ?The following portions of the patient's history were reviewed and updated as appropriate: allergies, current medications, past family history, past medical history, past social history, past surgical history and problem list.  ? ?Objective:  ? ?Vitals:  ? 06/21/21 1019  ?BP: 136/85  ?Pulse: 94  ?Weight: 181 lb (82.1 kg)  ? ? ?Fetal Status: Fetal Heart Rate (bpm): 148        ? ?General:  Alert, oriented and cooperative. Patient is in no acute distress.  ?Skin: Skin is warm and dry. No rash noted.   ?Cardiovascular: Normal heart rate noted  ?Respiratory: Normal respiratory effort, no problems with respiration noted  ?Abdomen: Soft, gravid, appropriate for gestational age.  Pain/Pressure: Absent     ?Pelvic: Cervical exam deferred        ?Extremities: Normal range of motion.  Edema: None  ?Mental Status: Normal mood and affect. Normal behavior. Normal judgment and  thought content.  ? ?Assessment and Plan:  ?Pregnancy: [id number] at [redacted]w[redacted]d ?1. Supervision of high risk pregnancy in first trimester ?Patient is doing well without complaints ?Pap smear collected ?Anatomy ultrasound ordered ?Patient opted for panorama today ? ?2. Chronic hypertension during pregnancy, antepartum ?Stable without medication ?Continue ASA ?CMP and protein:creatine ration ? ?3. Maternal obesity affecting pregnancy, antepartum ?ASA ?A1C today ? ?Preterm labor symptoms and general obstetric precautions including but not limited to vaginal bleeding, contractions, leaking of fluid and fetal movement were reviewed in detail with the patient. ?Please refer to After Visit Summary for other counseling recommendations.  ? ?Return in about 4 weeks (around 07/19/2021) for in person, ROB, High risk. ? ?No future appointments. ? ?Mora Bellman, MD ? ?

## 2021-06-22 ENCOUNTER — Telehealth: Payer: Self-pay

## 2021-06-22 LAB — PROTEIN / CREATININE RATIO, URINE
Creatinine, Urine: 64.1 mg/dL
Protein, Ur: 33 mg/dL
Protein/Creat Ratio: 515 mg/g creat — ABNORMAL HIGH (ref 0–200)

## 2021-06-22 LAB — COMPREHENSIVE METABOLIC PANEL
ALT: 13 IU/L (ref 0–32)
AST: 21 IU/L (ref 0–40)
Albumin/Globulin Ratio: 1.6 (ref 1.2–2.2)
Albumin: 4.5 g/dL (ref 3.8–4.8)
Alkaline Phosphatase: 41 IU/L — ABNORMAL LOW (ref 44–121)
BUN/Creatinine Ratio: 13 (ref 9–23)
BUN: 7 mg/dL (ref 6–20)
Bilirubin Total: <0.2 mg/dL (ref 0.0–1.2)
CO2: 20 mmol/L (ref 20–29)
Calcium: 9.9 mg/dL (ref 8.7–10.2)
Chloride: 103 mmol/L (ref 96–106)
Creatinine, Ser: 0.53 mg/dL — ABNORMAL LOW (ref 0.57–1.00)
Globulin, Total: 2.9 g/dL (ref 1.5–4.5)
Glucose: 89 mg/dL (ref 70–99)
Potassium: 4 mmol/L (ref 3.5–5.2)
Sodium: 138 mmol/L (ref 134–144)
Total Protein: 7.4 g/dL (ref 6.0–8.5)
eGFR: 124 mL/min/{1.73_m2} (ref >59)

## 2021-06-22 LAB — CYTOLOGY - PAP
Adequacy: ABSENT
Comment: NEGATIVE
Diagnosis: NEGATIVE
High risk HPV: NEGATIVE

## 2021-06-22 LAB — HEMOGLOBIN A1C
Est. average glucose Bld gHb Est-mCnc: 120 mg/dL
Hgb A1c MFr Bld: 5.8 % — ABNORMAL HIGH (ref 4.8–5.6)

## 2021-06-22 LAB — CERVICOVAGINAL ANCILLARY ONLY
Chlamydia: NEGATIVE
Comment: NEGATIVE
Comment: NORMAL
Neisseria Gonorrhea: NEGATIVE

## 2021-06-22 NOTE — Telephone Encounter (Signed)
Attempted to contact about glucose results and need for appt. No answer, left vm. ?

## 2021-06-22 NOTE — BH Specialist Note (Signed)
Integrated Behavioral Health Initial In-Person Visit ? ?MRN: [id number] ?Name: Brandi Sanders ? ?Number of Integrated Behavioral Health Clinician visits: 1 ?Session Start time:   10:30am ?Session End time: 10:45am ?Total time in minutes: 15 in person at [facility]  ? ?Types of Service: General Behavioral Integrated Care (BHI) ? ?Interpretor:No. Interpretor Name and Language: none ? ? Warm Hand Off Completed. ?  ? ?  ?[name] completed new ob introduction and is aware of practice offerings and services. WIC appt scheduled for 06/28/2021 ? ?Subjective: ?Brandi Sanders is a 36 y.o. female accompanied by Partner/Significant Other ?Patient was referred by Chana Bode MD for new ob introduction. ? ? ?Gwyndolyn Saxon, LCSW ? ? ? ? ? ? ? ? ?

## 2021-06-23 ENCOUNTER — Telehealth: Payer: Self-pay

## 2021-06-23 NOTE — Telephone Encounter (Signed)
S/w pt and advised of results, and pt is scheduled for early glucose testing ?

## 2021-06-28 ENCOUNTER — Other Ambulatory Visit: Payer: Self-pay | Admitting: *Deleted

## 2021-06-28 ENCOUNTER — Encounter: Payer: Self-pay | Admitting: Obstetrics and Gynecology

## 2021-06-28 ENCOUNTER — Other Ambulatory Visit: Payer: Self-pay

## 2021-06-28 ENCOUNTER — Other Ambulatory Visit: Payer: Medicaid Other

## 2021-06-28 DIAGNOSIS — O0991 Supervision of high risk pregnancy, unspecified, first trimester: Secondary | ICD-10-CM

## 2021-06-28 DIAGNOSIS — O0990 Supervision of high risk pregnancy, unspecified, unspecified trimester: Secondary | ICD-10-CM

## 2021-06-28 DIAGNOSIS — Z3401 Encounter for supervision of normal first pregnancy, first trimester: Secondary | ICD-10-CM | POA: Diagnosis not present

## 2021-06-29 ENCOUNTER — Encounter: Payer: Self-pay | Admitting: Obstetrics and Gynecology

## 2021-06-29 ENCOUNTER — Other Ambulatory Visit: Payer: Self-pay | Admitting: Obstetrics and Gynecology

## 2021-06-29 ENCOUNTER — Other Ambulatory Visit: Payer: Self-pay | Admitting: *Deleted

## 2021-06-29 DIAGNOSIS — O24419 Gestational diabetes mellitus in pregnancy, unspecified control: Secondary | ICD-10-CM | POA: Insufficient documentation

## 2021-06-29 DIAGNOSIS — O24415 Gestational diabetes mellitus in pregnancy, controlled by oral hypoglycemic drugs: Secondary | ICD-10-CM

## 2021-06-29 HISTORY — DX: Gestational diabetes mellitus in pregnancy, controlled by oral hypoglycemic drugs: O24.415

## 2021-06-29 LAB — GLUCOSE TOLERANCE, 2 HOURS W/ 1HR
Glucose, 1 hour: 166 mg/dL (ref 70–179)
Glucose, 2 hour: 153 mg/dL — ABNORMAL HIGH (ref 70–152)
Glucose, Fasting: 99 mg/dL — ABNORMAL HIGH (ref 70–91)

## 2021-06-29 MED ORDER — ACCU-CHEK GUIDE W/DEVICE KIT: 1.0000 | PACK | Freq: Four times a day (QID) | 0 refills | Status: DC

## 2021-06-29 MED ORDER — ACCU-CHEK SOFTCLIX LANCETS MISC: 1.0000 | Freq: Four times a day (QID) | 12 refills | Status: DC

## 2021-06-29 MED ORDER — ACCU-CHEK GUIDE VI STRP: ORAL_STRIP | 12 refills | Status: DC

## 2021-06-29 NOTE — Progress Notes (Signed)
TC to pt to notify of failed GTT, diabetes in pregnancy. Already aware of appt with diabetes educator, scheduled for tomorrow. Advised meter and testing supplies would be sent to her pharmacy. Verbalized understanding.

## 2021-06-30 ENCOUNTER — Encounter: Payer: Medicaid Other | Attending: Obstetrics and Gynecology | Admitting: Registered"

## 2021-06-30 DIAGNOSIS — O24419 Gestational diabetes mellitus in pregnancy, unspecified control: Secondary | ICD-10-CM | POA: Insufficient documentation

## 2021-07-01 ENCOUNTER — Encounter: Payer: Self-pay | Admitting: Registered"

## 2021-07-01 NOTE — Progress Notes (Signed)
Patient was seen on 06/30/21 for Gestational Diabetes self-management class at the [facility]. The following learning objectives were met by the patient during this course: ? ?States the definition of Gestational Diabetes ?States why dietary management is important in controlling blood glucose ?Describes the effects each nutrient has on blood glucose levels ?Demonstrates ability to create a balanced meal plan ?Demonstrates carbohydrate counting  ?States when to check blood glucose levels ?Demonstrates proper blood glucose monitoring techniques ?States the effect of stress and exercise on blood glucose levels ?States the importance of limiting caffeine and abstaining from alcohol and smoking ? ?Blood glucose monitor given: Accu-chek Guide Me ?Lot #[id number] ?Exp: 06/21/2022 ?CBG: 73 mg/dL  ? ?Patient instructed to monitor glucose levels: ?FBS: 60 - <95; 1 hour: <140; 2 hour: <120 ? ?Patient received handouts: ?Nutrition Diabetes and Pregnancy, including carb counting list ? ?Patient will be seen for follow-up as needed. ?

## 2021-07-03 DIAGNOSIS — Z419 Encounter for procedure for purposes other than remedying health state, unspecified: Secondary | ICD-10-CM | POA: Diagnosis not present

## 2021-07-13 ENCOUNTER — Other Ambulatory Visit: Payer: Medicaid Other

## 2021-07-19 ENCOUNTER — Encounter: Payer: Self-pay | Admitting: Obstetrics and Gynecology

## 2021-07-19 ENCOUNTER — Ambulatory Visit (INDEPENDENT_AMBULATORY_CARE_PROVIDER_SITE_OTHER): Payer: Medicaid Other | Admitting: Licensed Clinical Social Worker

## 2021-07-19 ENCOUNTER — Ambulatory Visit (INDEPENDENT_AMBULATORY_CARE_PROVIDER_SITE_OTHER): Payer: Medicaid Other | Admitting: Obstetrics and Gynecology

## 2021-07-19 VITALS — BP 143/83 | HR 105 | Wt 186.0 lb

## 2021-07-19 DIAGNOSIS — O0991 Supervision of high risk pregnancy, unspecified, first trimester: Secondary | ICD-10-CM | POA: Diagnosis not present

## 2021-07-19 DIAGNOSIS — O24419 Gestational diabetes mellitus in pregnancy, unspecified control: Secondary | ICD-10-CM

## 2021-07-19 DIAGNOSIS — O99210 Obesity complicating pregnancy, unspecified trimester: Secondary | ICD-10-CM

## 2021-07-19 DIAGNOSIS — F4322 Adjustment disorder with anxiety: Secondary | ICD-10-CM

## 2021-07-19 DIAGNOSIS — O10919 Unspecified pre-existing hypertension complicating pregnancy, unspecified trimester: Secondary | ICD-10-CM

## 2021-07-19 NOTE — Progress Notes (Signed)
? ?  PRENATAL VISIT NOTE ? ?Subjective:  ?Brandi Sanders is a 36 y.o. G3P2002 at [redacted]w[redacted]d being seen today for ongoing prenatal care.  She is currently monitored for the following issues for this high-risk pregnancy and has MIGRAINE, UNSPEC., W/O INTRACTABLE MIGRAINE; ALLERGIC RHINITIS, SEASONAL; HIDRADENITIS; Unspecified episodic mood disorder; Obesity; Essential hypertension, benign; Infertility, female; Moderate episode of recurrent major depressive disorder ([place]); Alcohol abuse with alcohol-induced mood disorder ([place]); No-show for appointment; Elevated blood pressure reading; Tobacco use; Supervision of high risk pregnancy in first trimester; Chronic hypertension during pregnancy, antepartum; Maternal obesity affecting pregnancy, antepartum; and Gestational diabetes mellitus (GDM) affecting pregnancy on their problem list. ? ?Patient reports no complaints.  Contractions: Not present. Vag. Bleeding: None.  Movement: Present. Denies leaking of fluid.  ? ?The following portions of the patient's history were reviewed and updated as appropriate: allergies, current medications, past family history, past medical history, past social history, past surgical history and problem list.  ? ?Objective:  ? ?Vitals:  ? 07/19/21 1115 07/19/21 1119  ?BP: (!) 149/83 (!) 143/83  ?Pulse: (!) 111 (!) 105  ?Weight: 186 lb (84.4 kg)   ? ? ?Fetal Status: Fetal Heart Rate (bpm): 160   Movement: Present    ? ?General:  Alert, oriented and cooperative. Patient is in no acute distress.  ?Skin: Skin is warm and dry. No rash noted.   ?Cardiovascular: Normal heart rate noted  ?Respiratory: Normal respiratory effort, no problems with respiration noted  ?Abdomen: Soft, gravid, appropriate for gestational age.  Pain/Pressure: Absent     ?Pelvic: Cervical exam deferred        ?Extremities: Normal range of motion.  Edema: Trace  ?Mental Status: Normal mood and affect. Normal behavior. Normal judgment and thought content.  ? ?Assessment and Plan:   ?Pregnancy: [id number] at [redacted]w[redacted]d ?1. Supervision of high risk pregnancy in first trimester ?Patient is doing well without complaints ?AFP today ?Anatomy ultrasound 5/2 ? ?2. Gestational diabetes mellitus (GDM) affecting pregnancy ?CBGs reviewed and 50% fasting elevated. Patient admits to drinking or snacking in the middle of the night. Postprandial well controlled with diet ?Corrective measures discussed to improve fasting readings. Also discussed starting metformin at bedtime ?Fetal echo previously ordered ? ?3. Chronic hypertension during pregnancy, antepartum ?Elevated BP today- will continue to monitor closely ?Continue ASA ? ?4. Maternal obesity affecting pregnancy, antepartum ? ? ?Preterm labor symptoms and general obstetric precautions including but not limited to vaginal bleeding, contractions, leaking of fluid and fetal movement were reviewed in detail with the patient. ?Please refer to After Visit Summary for other counseling recommendations.  ? ?No follow-ups on file. ? ?Future Appointments  ?Date Time Provider [place]  ?08/03/2021  9:15 AM [facility] NURSE [facility]  ?08/03/2021  9:30 AM [facility] US2 [facility]  ? ? ?Mora Bellman, MD ? ?

## 2021-07-19 NOTE — Progress Notes (Signed)
ROB 16.6 wks ?No concerns ?GDM, has log in phone ?

## 2021-07-20 NOTE — BH Specialist Note (Signed)
Integrated Behavioral Health Follow Up In-Person Visit ? ?MRN: [id number] ?Name: Brandi Sanders ? ?Number of Integrated Behavioral Health Clinician visits: No data recorded ?Session Start time: 1050 ?  ?Session End time: 1119 ? ?Total time in minutes: 29 ?In person at [facility]  ? ?Types of Service: Individual psychotherapy ? ?Interpretor:No. Interpretor Name and Language:  none ? ?Subjective: ?Brandi Sanders is a 36 y.o. female accompanied by n/a ?Patient was referred by Dr. Jolayne Panther for adjustment disorder with anxious mood. ?Patient reports the following symptoms/concerns: adjustment disorder  ?Duration of problem: approx 2 month; Severity of problem: mild ? ?Objective: ?Mood: good and Affect: Appropriate ?Risk of harm to self or others: No plan to harm self or others ? ?Life Context: ?Family and Social: Lives with father baby    ?School/Work: n/a ?Self-Care: n/a ?Life Changes: New pregnancy ? ?Patient and/or Family's Strengths/Protective Factors: ?Concrete supports in place (healthy food, safe environments, etc.) ? ?Goals Addressed: ?Patient will: ? Reduce symptoms of: anxiety  ? Increase knowledge and/or ability of: coping skills  ? Demonstrate ability to: Increase healthy adjustment to current life circumstances ? ?Progress towards Goals: ?Ongoing ? ?Interventions: ?Interventions utilized:  Supportive Counseling ?Standardized Assessments completed: PHQ 9 ? ?Patient and/or Family Response: Ms. Galvis responded well to visit.  ? ?Assessment: ?Patient currently experiencing adjustment disorder.  ? ?Patient may benefit from integrated behavioral health. ? ?Plan: ?Follow up with behavioral health clinician on : as needed  ?Behavioral recommendations: prioritize rest, relaxation and mindfulness technique to reduce stress ?Referral(s): Integrated [employer] (In Clinic) ?"From scale of 1-10, how likely are you to follow plan?": ? ?Gwyndolyn Saxon, LCSW ? ? ?

## 2021-07-21 LAB — AFP, SERUM, OPEN SPINA BIFIDA
AFP MoM: 0.86
AFP Value: 31 ng/mL
Gest. Age on Collection Date: 16.6 weeks
Maternal Age At EDD: 34.7 yr
OSBR Risk 1 IN: 10000
Test Results:: NEGATIVE
Weight: 186 [lb_av]

## 2021-08-02 DIAGNOSIS — Z419 Encounter for procedure for purposes other than remedying health state, unspecified: Secondary | ICD-10-CM | POA: Diagnosis not present

## 2021-08-03 ENCOUNTER — Ambulatory Visit: Payer: Medicaid Other | Admitting: *Deleted

## 2021-08-03 ENCOUNTER — Ambulatory Visit: Payer: Medicaid Other | Attending: Obstetrics and Gynecology

## 2021-08-03 ENCOUNTER — Other Ambulatory Visit: Payer: Self-pay | Admitting: *Deleted

## 2021-08-03 ENCOUNTER — Encounter: Payer: Self-pay | Admitting: *Deleted

## 2021-08-03 ENCOUNTER — Ambulatory Visit (HOSPITAL_BASED_OUTPATIENT_CLINIC_OR_DEPARTMENT_OTHER): Payer: Medicaid Other | Admitting: Maternal & Fetal Medicine

## 2021-08-03 VITALS — BP 135/88 | HR 105

## 2021-08-03 DIAGNOSIS — O0991 Supervision of high risk pregnancy, unspecified, first trimester: Secondary | ICD-10-CM | POA: Diagnosis not present

## 2021-08-03 DIAGNOSIS — O10919 Unspecified pre-existing hypertension complicating pregnancy, unspecified trimester: Secondary | ICD-10-CM | POA: Insufficient documentation

## 2021-08-03 DIAGNOSIS — O24419 Gestational diabetes mellitus in pregnancy, unspecified control: Secondary | ICD-10-CM | POA: Diagnosis not present

## 2021-08-03 DIAGNOSIS — Q210 Ventricular septal defect: Secondary | ICD-10-CM

## 2021-08-03 DIAGNOSIS — I10 Essential (primary) hypertension: Secondary | ICD-10-CM | POA: Diagnosis not present

## 2021-08-03 DIAGNOSIS — O99210 Obesity complicating pregnancy, unspecified trimester: Secondary | ICD-10-CM | POA: Diagnosis not present

## 2021-08-03 DIAGNOSIS — O99212 Obesity complicating pregnancy, second trimester: Secondary | ICD-10-CM

## 2021-08-03 DIAGNOSIS — O10913 Unspecified pre-existing hypertension complicating pregnancy, third trimester: Secondary | ICD-10-CM

## 2021-08-03 NOTE — Progress Notes (Signed)
MFM Brief Note ? ?Brandi Sanders is a 36 yo G3P2 who is here at 12/28/21 who is her at the request of Dr. Reece Leader regarding a detailed exam given early A2GDM vs pregestational diabetes. ? ?Brandi Sanders has a low risk cell free DNA, AFP and Horizon. ? ?Single intrauterine pregnancy is observed.  ?Normal anatomy with measurements consistent with dates. ?There is good fetal movement and amniotic fluid volume ?Suboptimal views of the fetal anatomy were obtained secondary to fetal position. ? ?We discussed the increased risk for preeclampsia, placental abruption and fetal growth restriction in women with chronic hypertension. She is taking low dose ASA for preeclampsia prevention. ? ?I explained that serial growth exams should be performed every 4 weeks throughout the pregnancy. Medical therapy should be initiated if blood pressure persist >150/100's. If medical therapy is initiated we recommend weekly testing at 32 weeks. ? ?She is aware of the s/sx or preeclampsia including headache, vision changes and right upper quadrant pain. ? ?Baseline labs of UPC, CBC, and CMP have been performed. UPC was abnormal with 500g of protein prior to 20 weeks, this suggest potential renal dysfunction of unknown etiology proteinuria. The CMP and CBC were normal. ?I discussed that this finding increases her risk for preeclampsia and if a diagnosis is made consider a new diagnosis related to proteinuria > 700 mg. ? ?Lastly we observed a possible ventricular septal defect. ?The most common forms of VSD (muscular and membranous) are known to close spontaneously, either prenatally or postnatally. The finding of even the smallest muscular VSD appears to increase the risk for additional cardiac abnormalities. Whether the presence of a single, isolated small muscular VSD increases the risk for genetic syndrome remains controversial. Small or moderate muscular defects may close prenatally or within a few years after delivery. The long-term prognosis  is usually excellent in general. ? ?We discussed the option of diagnostic amniotiocentesis including the risk and benefits in comparison the cell free DNA. At this time Brandi Sanders and her signifcant other declined additional testing, however, the opted to have a pediactric echocardiogram performed.  ? ?This was scheduled previously by per providers.  ? ?Follow up growth in 4 weeks.  ? ?I spent 30 minutes with > 50% in face to face consultation. ? ?Novella Olive, MD ?

## 2021-08-12 DIAGNOSIS — O24419 Gestational diabetes mellitus in pregnancy, unspecified control: Secondary | ICD-10-CM | POA: Diagnosis not present

## 2021-08-12 DIAGNOSIS — Z3A20 20 weeks gestation of pregnancy: Secondary | ICD-10-CM | POA: Diagnosis not present

## 2021-08-16 ENCOUNTER — Ambulatory Visit (INDEPENDENT_AMBULATORY_CARE_PROVIDER_SITE_OTHER): Payer: Medicaid Other | Admitting: Obstetrics and Gynecology

## 2021-08-16 ENCOUNTER — Encounter: Payer: Self-pay | Admitting: Obstetrics and Gynecology

## 2021-08-16 VITALS — BP 130/78 | HR 108 | Wt 187.0 lb

## 2021-08-16 DIAGNOSIS — O10919 Unspecified pre-existing hypertension complicating pregnancy, unspecified trimester: Secondary | ICD-10-CM

## 2021-08-16 DIAGNOSIS — O99212 Obesity complicating pregnancy, second trimester: Secondary | ICD-10-CM

## 2021-08-16 DIAGNOSIS — O10912 Unspecified pre-existing hypertension complicating pregnancy, second trimester: Secondary | ICD-10-CM

## 2021-08-16 DIAGNOSIS — O99210 Obesity complicating pregnancy, unspecified trimester: Secondary | ICD-10-CM

## 2021-08-16 DIAGNOSIS — O0992 Supervision of high risk pregnancy, unspecified, second trimester: Secondary | ICD-10-CM

## 2021-08-16 DIAGNOSIS — Z3A20 20 weeks gestation of pregnancy: Secondary | ICD-10-CM

## 2021-08-16 DIAGNOSIS — O24419 Gestational diabetes mellitus in pregnancy, unspecified control: Secondary | ICD-10-CM

## 2021-08-16 DIAGNOSIS — O0991 Supervision of high risk pregnancy, unspecified, first trimester: Secondary | ICD-10-CM

## 2021-08-16 MED ORDER — METFORMIN HCL 500 MG PO TABS: 500.0000 mg | ORAL_TABLET | Freq: Two times a day (BID) | ORAL | 5 refills | Status: DC

## 2021-08-16 NOTE — Progress Notes (Signed)
Pt is having some carpal symptoms, recommend wrist splints. ?Pt states her glucose reading have been up and down. ?Pt had fetal echo, results in care everywhere. ? ? ? ?

## 2021-08-16 NOTE — Progress Notes (Signed)
? ?  PRENATAL VISIT NOTE ? ?Subjective:  ?Brandi Sanders is a 36 y.o. G3P2002 at [redacted]w[redacted]d being seen today for ongoing prenatal care.  She is currently monitored for the following issues for this high-risk pregnancy and has MIGRAINE, UNSPEC., W/O INTRACTABLE MIGRAINE; ALLERGIC RHINITIS, SEASONAL; HIDRADENITIS; Unspecified episodic mood disorder; Obesity; Essential hypertension, benign; Infertility, female; Moderate episode of recurrent major depressive disorder ([place]); Alcohol abuse with alcohol-induced mood disorder ([place]); No-show for appointment; Elevated blood pressure reading; Tobacco use; Supervision of high risk pregnancy in first trimester; Chronic hypertension during pregnancy, antepartum; Maternal obesity affecting pregnancy, antepartum; and Gestational diabetes mellitus (GDM) affecting pregnancy on their problem list. ? ?Patient reports no complaints.  Contractions: Not present. Vag. Bleeding: None.  Movement: Present. Denies leaking of fluid.  ? ?The following portions of the patient's history were reviewed and updated as appropriate: allergies, current medications, past family history, past medical history, past social history, past surgical history and problem list.  ? ?Objective:  ? ?Vitals:  ? 08/16/21 0842  ?Weight: 187 lb (84.8 kg)  ? ? ?Fetal Status:     Movement: Present    ? ?General:  Alert, oriented and cooperative. Patient is in no acute distress.  ?Skin: Skin is warm and dry. No rash noted.   ?Cardiovascular: Normal heart rate noted  ?Respiratory: Normal respiratory effort, no problems with respiration noted  ?Abdomen: Soft, gravid, appropriate for gestational age.  Pain/Pressure: Absent     ?Pelvic: Cervical exam deferred        ?Extremities: Normal range of motion.  Edema: None  ?Mental Status: Normal mood and affect. Normal behavior. Normal judgment and thought content.  ? ?Assessment and Plan:  ?Pregnancy: [id number] at [redacted]w[redacted]d ?1. Supervision of high risk pregnancy in first trimester ?Patient  with left hand numbness consistent with carpal tunnel syndrome. Advised to use a wrist brace ? ? ?2. Gestational diabetes mellitus (GDM) affecting pregnancy ?CBGs reviewed and fasting elevated and 50% of postprandial elevated. Will start metformin 500 BID ?Follow up growth per MFM ?Normal fetal eco ? ?3. Chronic hypertension during pregnancy, antepartum ?Normotensive ?Continue ASA ? ?4. Maternal obesity affecting pregnancy, antepartum ? ? ?Preterm labor symptoms and general obstetric precautions including but not limited to vaginal bleeding, contractions, leaking of fluid and fetal movement were reviewed in detail with the patient. ?Please refer to After Visit Summary for other counseling recommendations.  ? ?Return in about 4 weeks (around 09/13/2021) for in person, ROB, High risk. ? ?Future Appointments  ?Date Time Provider [place]  ?09/01/2021 12:45 PM [facility] NURSE [facility]  ?09/01/2021  1:00 PM [facility] US1 [facility]  ? ? ?Mora Bellman, MD ? ?

## 2021-09-01 ENCOUNTER — Ambulatory Visit: Payer: Medicaid Other | Attending: Maternal & Fetal Medicine

## 2021-09-01 ENCOUNTER — Other Ambulatory Visit: Payer: Self-pay | Admitting: *Deleted

## 2021-09-01 ENCOUNTER — Ambulatory Visit: Payer: Medicaid Other | Admitting: *Deleted

## 2021-09-01 VITALS — BP 111/75 | HR 113

## 2021-09-01 DIAGNOSIS — O99210 Obesity complicating pregnancy, unspecified trimester: Secondary | ICD-10-CM | POA: Diagnosis not present

## 2021-09-01 DIAGNOSIS — O24415 Gestational diabetes mellitus in pregnancy, controlled by oral hypoglycemic drugs: Secondary | ICD-10-CM

## 2021-09-01 DIAGNOSIS — O99212 Obesity complicating pregnancy, second trimester: Secondary | ICD-10-CM

## 2021-09-01 DIAGNOSIS — O0991 Supervision of high risk pregnancy, unspecified, first trimester: Secondary | ICD-10-CM | POA: Insufficient documentation

## 2021-09-01 DIAGNOSIS — O10919 Unspecified pre-existing hypertension complicating pregnancy, unspecified trimester: Secondary | ICD-10-CM

## 2021-09-01 DIAGNOSIS — Z3A23 23 weeks gestation of pregnancy: Secondary | ICD-10-CM

## 2021-09-01 DIAGNOSIS — E669 Obesity, unspecified: Secondary | ICD-10-CM | POA: Diagnosis not present

## 2021-09-01 DIAGNOSIS — O10913 Unspecified pre-existing hypertension complicating pregnancy, third trimester: Secondary | ICD-10-CM | POA: Diagnosis not present

## 2021-09-01 DIAGNOSIS — O10012 Pre-existing essential hypertension complicating pregnancy, second trimester: Secondary | ICD-10-CM

## 2021-09-01 DIAGNOSIS — O24419 Gestational diabetes mellitus in pregnancy, unspecified control: Secondary | ICD-10-CM

## 2021-09-01 DIAGNOSIS — O10912 Unspecified pre-existing hypertension complicating pregnancy, second trimester: Secondary | ICD-10-CM

## 2021-09-02 DIAGNOSIS — Z419 Encounter for procedure for purposes other than remedying health state, unspecified: Secondary | ICD-10-CM | POA: Diagnosis not present

## 2021-09-07 ENCOUNTER — Encounter: Payer: Self-pay | Admitting: *Deleted

## 2021-09-13 ENCOUNTER — Ambulatory Visit (INDEPENDENT_AMBULATORY_CARE_PROVIDER_SITE_OTHER): Payer: Medicaid Other | Admitting: Advanced Practice Midwife

## 2021-09-13 VITALS — BP 118/74 | HR 108 | Wt 182.5 lb

## 2021-09-13 DIAGNOSIS — F1911 Other psychoactive substance abuse, in remission: Secondary | ICD-10-CM

## 2021-09-13 DIAGNOSIS — O99210 Obesity complicating pregnancy, unspecified trimester: Secondary | ICD-10-CM

## 2021-09-13 DIAGNOSIS — Z3A24 24 weeks gestation of pregnancy: Secondary | ICD-10-CM

## 2021-09-13 DIAGNOSIS — O10919 Unspecified pre-existing hypertension complicating pregnancy, unspecified trimester: Secondary | ICD-10-CM

## 2021-09-13 DIAGNOSIS — O0991 Supervision of high risk pregnancy, unspecified, first trimester: Secondary | ICD-10-CM

## 2021-09-13 DIAGNOSIS — O24415 Gestational diabetes mellitus in pregnancy, controlled by oral hypoglycemic drugs: Secondary | ICD-10-CM

## 2021-09-13 NOTE — Progress Notes (Signed)
   PRENATAL VISIT NOTE  Subjective:  Brandi Sanders is a 36 y.o. G3P2002 at [redacted]w[redacted]d being seen today for ongoing prenatal care.  She is currently monitored for the following issues for this high-risk pregnancy and has MIGRAINE, UNSPEC., W/O INTRACTABLE MIGRAINE; ALLERGIC RHINITIS, SEASONAL; HIDRADENITIS; Unspecified episodic mood disorder; Obesity; Essential hypertension, benign; Infertility, female; Moderate episode of recurrent major depressive disorder (HCC); Alcohol abuse with alcohol-induced mood disorder (HCC); No-show for appointment; Elevated blood pressure reading; Tobacco use; Supervision of high risk pregnancy in first trimester; Chronic hypertension during pregnancy, antepartum; Maternal obesity affecting pregnancy, antepartum; and Gestational diabetes mellitus (GDM) controlled on oral hypoglycemic drug, antepartum on their problem list.  Patient reports no complaints.  Contractions: Not present. Vag. Bleeding: None.  Movement: Present. Denies leaking of fluid.   The following portions of the patient's history were reviewed and updated as appropriate: allergies, current medications, past family history, past medical history, past social history, past surgical history and problem list.   Objective:   Vitals:   09/13/21 0846  BP: 118/74  Pulse: (!) 108  Weight: 182 lb 8 oz (82.8 kg)    Fetal Status: Fetal Heart Rate (bpm): 147   Movement: Present     General:  Alert, oriented and cooperative. Patient is in no acute distress.  Skin: Skin is warm and dry. No rash noted.   Cardiovascular: Normal heart rate noted  Respiratory: Normal respiratory effort, no problems with respiration noted  Abdomen: Soft, gravid, appropriate for gestational age.  Pain/Pressure: Absent     Pelvic: Cervical exam deferred        Extremities: Normal range of motion.  Edema: None  Mental Status: Normal mood and affect. Normal behavior. Normal judgment and thought content.   Assessment and Plan:   Pregnancy: G3P2002 at [redacted]w[redacted]d 1. Supervision of high risk pregnancy in first trimester --Anticipatory guidance about next visits/weeks of pregnancy given.   2. Gestational diabetes mellitus (GDM) controlled on oral hypoglycemic drug, antepartum --On Metformin 500 mg BID --Reviewed glucose log, 4 out of 20 fasting values elevated, highest 106. 4-5 PP values elevated out of 28, none above 144.  --Continue current course, discussed that medication dose may need to increase if more elevated values as pregnancy progresses.  Pt states understanding.   3. Chronic hypertension during pregnancy, antepartum --BP wnl today,  taking ASA  4. [redacted] weeks gestation of pregnancy    6. Hx of substance abuse (HCC) --Discussed with pt today, who denies any use of drugs or alcohol during this pregnancy  --Offered IBH in our office, pt has contact information and will make appt later if desired  Preterm labor symptoms and general obstetric precautions including but not limited to vaginal bleeding, contractions, leaking of fluid and fetal movement were reviewed in detail with the patient. Please refer to After Visit Summary for other counseling recommendations.   Return in about 2 weeks (around 09/27/2021) for HROB.  Future Appointments  Date Time Provider Department Center  09/29/2021 10:30 AM [facility] NURSE [facility]  09/29/2021 10:45 AM [facility] US5 [facility]  09/30/2021  8:55 AM Alysia Penna Marolyn Hammock, MD [facility] None    Sharen Counter, CNM

## 2021-09-13 NOTE — Progress Notes (Signed)
Pt presents for ROB.  No complaints today.  FHT- 147

## 2021-09-29 ENCOUNTER — Ambulatory Visit: Payer: Medicaid Other | Attending: Obstetrics

## 2021-09-29 ENCOUNTER — Other Ambulatory Visit: Payer: Self-pay | Admitting: *Deleted

## 2021-09-29 ENCOUNTER — Ambulatory Visit: Payer: Medicaid Other | Admitting: *Deleted

## 2021-09-29 VITALS — BP 117/73 | HR 114

## 2021-09-29 DIAGNOSIS — O24415 Gestational diabetes mellitus in pregnancy, controlled by oral hypoglycemic drugs: Secondary | ICD-10-CM | POA: Insufficient documentation

## 2021-09-29 DIAGNOSIS — Z3A27 27 weeks gestation of pregnancy: Secondary | ICD-10-CM | POA: Diagnosis not present

## 2021-09-29 DIAGNOSIS — O99210 Obesity complicating pregnancy, unspecified trimester: Secondary | ICD-10-CM | POA: Insufficient documentation

## 2021-09-29 DIAGNOSIS — O10012 Pre-existing essential hypertension complicating pregnancy, second trimester: Secondary | ICD-10-CM | POA: Diagnosis not present

## 2021-09-29 DIAGNOSIS — O10919 Unspecified pre-existing hypertension complicating pregnancy, unspecified trimester: Secondary | ICD-10-CM | POA: Diagnosis not present

## 2021-09-29 DIAGNOSIS — O0991 Supervision of high risk pregnancy, unspecified, first trimester: Secondary | ICD-10-CM | POA: Diagnosis not present

## 2021-09-29 DIAGNOSIS — O24312 Unspecified pre-existing diabetes mellitus in pregnancy, second trimester: Secondary | ICD-10-CM

## 2021-09-29 DIAGNOSIS — O99212 Obesity complicating pregnancy, second trimester: Secondary | ICD-10-CM

## 2021-09-29 DIAGNOSIS — E669 Obesity, unspecified: Secondary | ICD-10-CM

## 2021-09-29 DIAGNOSIS — O10912 Unspecified pre-existing hypertension complicating pregnancy, second trimester: Secondary | ICD-10-CM

## 2021-09-30 ENCOUNTER — Encounter: Payer: Medicaid Other | Admitting: Obstetrics and Gynecology

## 2021-10-02 DIAGNOSIS — Z419 Encounter for procedure for purposes other than remedying health state, unspecified: Secondary | ICD-10-CM | POA: Diagnosis not present

## 2021-10-11 ENCOUNTER — Ambulatory Visit (INDEPENDENT_AMBULATORY_CARE_PROVIDER_SITE_OTHER): Payer: Medicaid Other | Admitting: Obstetrics & Gynecology

## 2021-10-11 VITALS — BP 112/76 | HR 114 | Wt 184.0 lb

## 2021-10-11 DIAGNOSIS — O0991 Supervision of high risk pregnancy, unspecified, first trimester: Secondary | ICD-10-CM | POA: Diagnosis not present

## 2021-10-11 DIAGNOSIS — O10911 Unspecified pre-existing hypertension complicating pregnancy, first trimester: Secondary | ICD-10-CM

## 2021-10-11 DIAGNOSIS — Z23 Encounter for immunization: Secondary | ICD-10-CM | POA: Diagnosis not present

## 2021-10-11 DIAGNOSIS — O24415 Gestational diabetes mellitus in pregnancy, controlled by oral hypoglycemic drugs: Secondary | ICD-10-CM

## 2021-10-11 DIAGNOSIS — O10919 Unspecified pre-existing hypertension complicating pregnancy, unspecified trimester: Secondary | ICD-10-CM

## 2021-10-11 DIAGNOSIS — Z3A28 28 weeks gestation of pregnancy: Secondary | ICD-10-CM

## 2021-10-11 NOTE — Progress Notes (Unsigned)
   PRENATAL VISIT NOTE  Subjective:  Brandi Sanders is a 36 y.o. G3P2002 at [redacted]w[redacted]d being seen today for ongoing prenatal care.  She is currently monitored for the following issues for this high-risk pregnancy and has MIGRAINE, UNSPEC., W/O INTRACTABLE MIGRAINE; ALLERGIC RHINITIS, SEASONAL; HIDRADENITIS; Unspecified episodic mood disorder; Obesity; Essential hypertension, benign; Infertility, female; Moderate episode of recurrent major depressive disorder (HCC); Alcohol abuse with alcohol-induced mood disorder (HCC); No-show for appointment; Elevated blood pressure reading; Tobacco use; Supervision of high risk pregnancy in first trimester; Chronic hypertension during pregnancy, antepartum; Maternal obesity affecting pregnancy, antepartum; and Gestational diabetes mellitus (GDM) controlled on oral hypoglycemic drug, antepartum on their problem list.  Patient reports no complaints.  Contractions: Not present. Vag. Bleeding: None.  Movement: Present. Denies leaking of fluid.   The following portions of the patient's history were reviewed and updated as appropriate: allergies, current medications, past family history, past medical history, past social history, past surgical history and problem list.   Objective:   Vitals:   10/11/21 0848  BP: 112/76  Pulse: (!) 114  Weight: 184 lb (83.5 kg)    Fetal Status: Fetal Heart Rate (bpm): 160   Movement: Present     General:  Alert, oriented and cooperative. Patient is in no acute distress.  Skin: Skin is warm and dry. No rash noted.   Cardiovascular: Normal heart rate noted  Respiratory: Normal respiratory effort, no problems with respiration noted  Abdomen: Soft, gravid, appropriate for gestational age.  Pain/Pressure: Absent     Pelvic: Cervical exam deferred        Extremities: Normal range of motion.     Mental Status: Normal mood and affect. Normal behavior. Normal judgment and thought content.   Assessment and Plan:  Pregnancy: G3P2002 at  [redacted]w[redacted]d 1. Supervision of high risk pregnancy in first trimester F/u US are scheduled  2. Chronic hypertension during pregnancy, antepartum Good control  3. Gestational diabetes mellitus (GDM) controlled on oral hypoglycemic drug, antepartum Control is good but encouraged to test QID  Preterm labor symptoms and general obstetric precautions including but not limited to vaginal bleeding, contractions, leaking of fluid and fetal movement were reviewed in detail with the patient. Please refer to After Visit Summary for other counseling recommendations.   Return in about 2 weeks (around 10/25/2021).  Future Appointments  Date Time Provider Department Center  10/25/2021  8:55 AM Constant, Gigi Gin, MD [facility] None  10/28/2021  9:30 AM [facility] NURSE [facility]  10/28/2021  9:45 AM [facility] US5 [facility]  11/03/2021 10:30 AM [facility] NURSE [facility]  11/03/2021 10:45 AM [facility] US6 [facility]  11/08/2021  8:55 AM Alysia Penna, Marolyn Hammock, MD [facility] None  11/22/2021  8:55 AM Constant, Gigi Gin, MD [facility] None    Scheryl Darter, MD

## 2021-10-25 ENCOUNTER — Encounter: Payer: Self-pay | Admitting: Obstetrics and Gynecology

## 2021-10-25 ENCOUNTER — Ambulatory Visit (INDEPENDENT_AMBULATORY_CARE_PROVIDER_SITE_OTHER): Payer: Medicaid Other | Admitting: Obstetrics and Gynecology

## 2021-10-25 VITALS — BP 132/81 | HR 116 | Wt 185.6 lb

## 2021-10-25 DIAGNOSIS — O99210 Obesity complicating pregnancy, unspecified trimester: Secondary | ICD-10-CM

## 2021-10-25 DIAGNOSIS — O10919 Unspecified pre-existing hypertension complicating pregnancy, unspecified trimester: Secondary | ICD-10-CM

## 2021-10-25 DIAGNOSIS — O0991 Supervision of high risk pregnancy, unspecified, first trimester: Secondary | ICD-10-CM | POA: Diagnosis not present

## 2021-10-25 DIAGNOSIS — O24415 Gestational diabetes mellitus in pregnancy, controlled by oral hypoglycemic drugs: Secondary | ICD-10-CM

## 2021-10-25 MED ORDER — PANTOPRAZOLE SODIUM 40 MG PO TBEC: 40.0000 mg | DELAYED_RELEASE_TABLET | Freq: Every day | ORAL | 1 refills | Status: DC

## 2021-10-25 MED ORDER — METFORMIN HCL 500 MG PO TABS: 1000.0000 mg | ORAL_TABLET | Freq: Two times a day (BID) | ORAL | 1 refills | Status: DC

## 2021-10-25 NOTE — Progress Notes (Signed)
   PRENATAL VISIT NOTE  Subjective:  Brandi Sanders is a 36 y.o. G3P2002 at [redacted]w[redacted]d being seen today for ongoing prenatal care.  She is currently monitored for the following issues for this high-risk pregnancy and has MIGRAINE, UNSPEC., W/O INTRACTABLE MIGRAINE; ALLERGIC RHINITIS, SEASONAL; HIDRADENITIS; Unspecified episodic mood disorder; Obesity; Essential hypertension, benign; Infertility, female; Moderate episode of recurrent major depressive disorder (HCC); Alcohol abuse with alcohol-induced mood disorder (HCC); No-show for appointment; Elevated blood pressure reading; Tobacco use; Supervision of high risk pregnancy in first trimester; Chronic hypertension during pregnancy, antepartum; Maternal obesity affecting pregnancy, antepartum; and Gestational diabetes mellitus (GDM) controlled on oral hypoglycemic drug, antepartum on their problem list.  Patient reports no complaints.  Contractions: Not present. Vag. Bleeding: None.  Movement: Present. Denies leaking of fluid.   The following portions of the patient's history were reviewed and updated as appropriate: allergies, current medications, past family history, past medical history, past social history, past surgical history and problem list.   Objective:   Vitals:   10/25/21 0910  BP: 132/81  Pulse: (!) 116  Weight: 185 lb 9.6 oz (84.2 kg)    Fetal Status: Fetal Heart Rate (bpm): 144   Movement: Present     General:  Alert, oriented and cooperative. Patient is in no acute distress.  Skin: Skin is warm and dry. No rash noted.   Cardiovascular: Normal heart rate noted  Respiratory: Normal respiratory effort, no problems with respiration noted  Abdomen: Soft, gravid, appropriate for gestational age.  Pain/Pressure: Absent     Pelvic: Cervical exam deferred        Extremities: Normal range of motion.  Edema: Trace  Mental Status: Normal mood and affect. Normal behavior. Normal judgment and thought content.   Assessment and Plan:   Pregnancy: G3P2002 at [redacted]w[redacted]d 1. Chronic hypertension during pregnancy, antepartum BP stable without medication Continue ASA  2. Maternal obesity affecting pregnancy, antepartum Appropriate weight gain thus far  3. Supervision of high risk pregnancy in first trimester Patient is doing well without complaints She reports acid reflux worst at night- Rx protonix provided Patient remain undecided on contraception  4. Gestational diabetes mellitus (GDM) controlled on oral hypoglycemic drug, antepartum CBGs reviewed and greater than 50% fasting elevated, majority of pp within range Will increase metformin to 1000 mg BID Follow up growth ultrasound this week  Preterm labor symptoms and general obstetric precautions including but not limited to vaginal bleeding, contractions, leaking of fluid and fetal movement were reviewed in detail with the patient. Please refer to After Visit Summary for other counseling recommendations.   No follow-ups on file.  Future Appointments  Date Time Provider Department Center  10/28/2021  9:30 AM [facility] NURSE [facility]  10/28/2021  9:45 AM [facility] US5 [facility]  11/03/2021 10:30 AM [facility]URSE [facility]  11/03/2021 10:45 AM [facility] US6 [facility]  11/08/2021  8:55 AM Hermina Staggers, MD [facility] None  11/22/2021  8:55 AM Coila Wardell, Gigi Gin, MD [facility] None    Catalina Antigua, MD

## 2021-10-25 NOTE — Progress Notes (Signed)
Pt presents for ROB visit with no concerns at this time.  

## 2021-10-26 LAB — CBC
Hematocrit: 33.8 % — ABNORMAL LOW (ref 34.0–46.6)
Hemoglobin: 11.2 g/dL (ref 11.1–15.9)
MCH: 29.6 pg (ref 26.6–33.0)
MCHC: 33.1 g/dL (ref 31.5–35.7)
MCV: 89 fL (ref 79–97)
Platelets: 238 10*3/uL (ref 150–450)
RBC: 3.78 x10E6/uL (ref 3.77–5.28)
RDW: 13.2 % (ref 11.7–15.4)
WBC: 7.6 10*3/uL (ref 3.4–10.8)

## 2021-10-26 LAB — RPR: RPR Ser Ql: NONREACTIVE

## 2021-10-26 LAB — HIV ANTIBODY (ROUTINE TESTING W REFLEX): HIV Screen 4th Generation wRfx: NONREACTIVE

## 2021-10-28 ENCOUNTER — Other Ambulatory Visit: Payer: Self-pay | Admitting: *Deleted

## 2021-10-28 ENCOUNTER — Ambulatory Visit: Payer: Medicaid Other | Admitting: *Deleted

## 2021-10-28 ENCOUNTER — Ambulatory Visit: Payer: Medicaid Other | Attending: Maternal & Fetal Medicine

## 2021-10-28 VITALS — BP 124/75 | HR 105

## 2021-10-28 DIAGNOSIS — O10013 Pre-existing essential hypertension complicating pregnancy, third trimester: Secondary | ICD-10-CM | POA: Diagnosis not present

## 2021-10-28 DIAGNOSIS — O24415 Gestational diabetes mellitus in pregnancy, controlled by oral hypoglycemic drugs: Secondary | ICD-10-CM

## 2021-10-28 DIAGNOSIS — O10912 Unspecified pre-existing hypertension complicating pregnancy, second trimester: Secondary | ICD-10-CM | POA: Diagnosis not present

## 2021-10-28 DIAGNOSIS — Z3A31 31 weeks gestation of pregnancy: Secondary | ICD-10-CM | POA: Diagnosis not present

## 2021-10-28 DIAGNOSIS — O99213 Obesity complicating pregnancy, third trimester: Secondary | ICD-10-CM

## 2021-10-28 DIAGNOSIS — O0991 Supervision of high risk pregnancy, unspecified, first trimester: Secondary | ICD-10-CM

## 2021-10-28 DIAGNOSIS — O99210 Obesity complicating pregnancy, unspecified trimester: Secondary | ICD-10-CM | POA: Diagnosis not present

## 2021-10-28 DIAGNOSIS — O99212 Obesity complicating pregnancy, second trimester: Secondary | ICD-10-CM | POA: Insufficient documentation

## 2021-10-28 DIAGNOSIS — O10913 Unspecified pre-existing hypertension complicating pregnancy, third trimester: Secondary | ICD-10-CM

## 2021-10-28 DIAGNOSIS — O10919 Unspecified pre-existing hypertension complicating pregnancy, unspecified trimester: Secondary | ICD-10-CM | POA: Diagnosis not present

## 2021-10-28 DIAGNOSIS — O24312 Unspecified pre-existing diabetes mellitus in pregnancy, second trimester: Secondary | ICD-10-CM | POA: Diagnosis not present

## 2021-11-02 DIAGNOSIS — Z419 Encounter for procedure for purposes other than remedying health state, unspecified: Secondary | ICD-10-CM | POA: Diagnosis not present

## 2021-11-03 ENCOUNTER — Ambulatory Visit: Payer: Medicaid Other | Admitting: *Deleted

## 2021-11-03 ENCOUNTER — Ambulatory Visit: Payer: Medicaid Other | Attending: Maternal & Fetal Medicine

## 2021-11-03 VITALS — BP 135/78 | HR 115

## 2021-11-03 DIAGNOSIS — O99210 Obesity complicating pregnancy, unspecified trimester: Secondary | ICD-10-CM | POA: Diagnosis not present

## 2021-11-03 DIAGNOSIS — O10919 Unspecified pre-existing hypertension complicating pregnancy, unspecified trimester: Secondary | ICD-10-CM | POA: Diagnosis not present

## 2021-11-03 DIAGNOSIS — Z3A32 32 weeks gestation of pregnancy: Secondary | ICD-10-CM

## 2021-11-03 DIAGNOSIS — O10013 Pre-existing essential hypertension complicating pregnancy, third trimester: Secondary | ICD-10-CM

## 2021-11-03 DIAGNOSIS — O0991 Supervision of high risk pregnancy, unspecified, first trimester: Secondary | ICD-10-CM

## 2021-11-03 DIAGNOSIS — O10912 Unspecified pre-existing hypertension complicating pregnancy, second trimester: Secondary | ICD-10-CM | POA: Diagnosis not present

## 2021-11-03 DIAGNOSIS — O24312 Unspecified pre-existing diabetes mellitus in pregnancy, second trimester: Secondary | ICD-10-CM | POA: Insufficient documentation

## 2021-11-03 DIAGNOSIS — O99212 Obesity complicating pregnancy, second trimester: Secondary | ICD-10-CM | POA: Insufficient documentation

## 2021-11-03 DIAGNOSIS — O24415 Gestational diabetes mellitus in pregnancy, controlled by oral hypoglycemic drugs: Secondary | ICD-10-CM

## 2021-11-04 ENCOUNTER — Other Ambulatory Visit: Payer: Self-pay

## 2021-11-04 ENCOUNTER — Ambulatory Visit: Payer: Medicaid Other

## 2021-11-08 ENCOUNTER — Encounter: Payer: Self-pay | Admitting: Obstetrics and Gynecology

## 2021-11-08 ENCOUNTER — Ambulatory Visit (INDEPENDENT_AMBULATORY_CARE_PROVIDER_SITE_OTHER): Payer: Medicaid Other | Admitting: Obstetrics and Gynecology

## 2021-11-08 VITALS — BP 135/85 | HR 109 | Wt 189.7 lb

## 2021-11-08 DIAGNOSIS — O24415 Gestational diabetes mellitus in pregnancy, controlled by oral hypoglycemic drugs: Secondary | ICD-10-CM

## 2021-11-08 DIAGNOSIS — O0991 Supervision of high risk pregnancy, unspecified, first trimester: Secondary | ICD-10-CM

## 2021-11-08 DIAGNOSIS — O99213 Obesity complicating pregnancy, third trimester: Secondary | ICD-10-CM

## 2021-11-08 DIAGNOSIS — O99210 Obesity complicating pregnancy, unspecified trimester: Secondary | ICD-10-CM

## 2021-11-08 DIAGNOSIS — Z3A32 32 weeks gestation of pregnancy: Secondary | ICD-10-CM

## 2021-11-08 DIAGNOSIS — O0993 Supervision of high risk pregnancy, unspecified, third trimester: Secondary | ICD-10-CM

## 2021-11-08 DIAGNOSIS — O10919 Unspecified pre-existing hypertension complicating pregnancy, unspecified trimester: Secondary | ICD-10-CM

## 2021-11-08 DIAGNOSIS — O10913 Unspecified pre-existing hypertension complicating pregnancy, third trimester: Secondary | ICD-10-CM

## 2021-11-08 NOTE — Patient Instructions (Signed)

## 2021-11-08 NOTE — Progress Notes (Signed)
Pt presents for ROB visit. No concerns at this time.  

## 2021-11-08 NOTE — Progress Notes (Signed)
Subjective:  Brandi Sanders is a 36 y.o. G3P2002 at [redacted]w[redacted]d being seen today for ongoing prenatal care.  She is currently monitored for the following issues for this high-risk pregnancy and has MIGRAINE, UNSPEC., W/O INTRACTABLE MIGRAINE; ALLERGIC RHINITIS, SEASONAL; HIDRADENITIS; Unspecified episodic mood disorder; Obesity; Essential hypertension, benign; Infertility, female; Moderate episode of recurrent major depressive disorder (HCC); Alcohol abuse with alcohol-induced mood disorder (HCC); No-show for appointment; Elevated blood pressure reading; Tobacco use; Supervision of high risk pregnancy in first trimester; Chronic hypertension during pregnancy, antepartum; Maternal obesity affecting pregnancy, antepartum; and Gestational diabetes mellitus (GDM) controlled on oral hypoglycemic drug, antepartum on their problem list.  Patient reports general discomforts of pregnancy.  Contractions: Not present. Vag. Bleeding: None.  Movement: Present. Denies leaking of fluid.   The following portions of the patient's history were reviewed and updated as appropriate: allergies, current medications, past family history, past medical history, past social history, past surgical history and problem list. Problem list updated.  Objective:   Vitals:   11/08/21 0927  BP: 135/85  Pulse: (!) 109  Weight: 189 lb 11.2 oz (86 kg)    Fetal Status: Fetal Heart Rate (bpm): 143   Movement: Present     General:  Alert, oriented and cooperative. Patient is in no acute distress.  Skin: Skin is warm and dry. No rash noted.   Cardiovascular: Normal heart rate noted  Respiratory: Normal respiratory effort, no problems with respiration noted  Abdomen: Soft, gravid, appropriate for gestational age. Pain/Pressure: Absent     Pelvic:  Cervical exam deferred        Extremities: Normal range of motion.  Edema: None  Mental Status: Normal mood and affect. Normal behavior. Normal judgment and thought content.   Urinalysis:       Assessment and Plan:  Pregnancy: G3P2002 at [redacted]w[redacted]d  1. Chronic hypertension during pregnancy, antepartum Stable No Meds Serial growth scans as per MFM  2. Maternal obesity affecting pregnancy, antepartum Stable  3. Supervision of high risk pregnancy in first trimester Stable  4. Gestational diabetes mellitus (GDM) controlled on oral hypoglycemic drug, antepartum CBG's better with increase in Metformin Serial growth scans and weekly antenatal testing as per MFM  Preterm labor symptoms and general obstetric precautions including but not limited to vaginal bleeding, contractions, leaking of fluid and fetal movement were reviewed in detail with the patient. Please refer to After Visit Summary for other counseling recommendations.  Return in about 2 weeks (around 11/22/2021) for OB visit, face to face, MD only.   Hermina Staggers, MD

## 2021-11-12 ENCOUNTER — Ambulatory Visit: Payer: Medicaid Other | Admitting: *Deleted

## 2021-11-12 ENCOUNTER — Ambulatory Visit: Payer: Medicaid Other | Attending: Obstetrics and Gynecology

## 2021-11-12 VITALS — BP 103/59 | HR 119

## 2021-11-12 DIAGNOSIS — O24415 Gestational diabetes mellitus in pregnancy, controlled by oral hypoglycemic drugs: Secondary | ICD-10-CM | POA: Insufficient documentation

## 2021-11-12 DIAGNOSIS — O99210 Obesity complicating pregnancy, unspecified trimester: Secondary | ICD-10-CM | POA: Insufficient documentation

## 2021-11-12 DIAGNOSIS — O10013 Pre-existing essential hypertension complicating pregnancy, third trimester: Secondary | ICD-10-CM

## 2021-11-12 DIAGNOSIS — O10913 Unspecified pre-existing hypertension complicating pregnancy, third trimester: Secondary | ICD-10-CM | POA: Diagnosis not present

## 2021-11-12 DIAGNOSIS — O0991 Supervision of high risk pregnancy, unspecified, first trimester: Secondary | ICD-10-CM

## 2021-11-12 DIAGNOSIS — O10919 Unspecified pre-existing hypertension complicating pregnancy, unspecified trimester: Secondary | ICD-10-CM

## 2021-11-12 DIAGNOSIS — O99213 Obesity complicating pregnancy, third trimester: Secondary | ICD-10-CM | POA: Diagnosis not present

## 2021-11-12 DIAGNOSIS — Z3A33 33 weeks gestation of pregnancy: Secondary | ICD-10-CM

## 2021-11-18 ENCOUNTER — Ambulatory Visit: Payer: Medicaid Other | Admitting: *Deleted

## 2021-11-18 ENCOUNTER — Ambulatory Visit: Payer: Medicaid Other | Attending: Obstetrics and Gynecology

## 2021-11-18 VITALS — BP 120/75 | HR 120

## 2021-11-18 DIAGNOSIS — O10013 Pre-existing essential hypertension complicating pregnancy, third trimester: Secondary | ICD-10-CM | POA: Diagnosis not present

## 2021-11-18 DIAGNOSIS — O99210 Obesity complicating pregnancy, unspecified trimester: Secondary | ICD-10-CM | POA: Insufficient documentation

## 2021-11-18 DIAGNOSIS — O0991 Supervision of high risk pregnancy, unspecified, first trimester: Secondary | ICD-10-CM | POA: Insufficient documentation

## 2021-11-18 DIAGNOSIS — O24415 Gestational diabetes mellitus in pregnancy, controlled by oral hypoglycemic drugs: Secondary | ICD-10-CM | POA: Insufficient documentation

## 2021-11-18 DIAGNOSIS — Z3A34 34 weeks gestation of pregnancy: Secondary | ICD-10-CM | POA: Diagnosis not present

## 2021-11-18 DIAGNOSIS — O99213 Obesity complicating pregnancy, third trimester: Secondary | ICD-10-CM | POA: Diagnosis not present

## 2021-11-18 DIAGNOSIS — O10919 Unspecified pre-existing hypertension complicating pregnancy, unspecified trimester: Secondary | ICD-10-CM

## 2021-11-18 DIAGNOSIS — O10913 Unspecified pre-existing hypertension complicating pregnancy, third trimester: Secondary | ICD-10-CM | POA: Insufficient documentation

## 2021-11-22 ENCOUNTER — Encounter: Payer: Medicaid Other | Admitting: Obstetrics and Gynecology

## 2021-11-25 ENCOUNTER — Ambulatory Visit: Payer: Medicaid Other | Admitting: *Deleted

## 2021-11-25 ENCOUNTER — Other Ambulatory Visit: Payer: Self-pay | Admitting: *Deleted

## 2021-11-25 ENCOUNTER — Ambulatory Visit: Payer: Medicaid Other | Attending: Obstetrics and Gynecology

## 2021-11-25 VITALS — BP 128/78 | HR 110

## 2021-11-25 DIAGNOSIS — Z3A35 35 weeks gestation of pregnancy: Secondary | ICD-10-CM

## 2021-11-25 DIAGNOSIS — O10919 Unspecified pre-existing hypertension complicating pregnancy, unspecified trimester: Secondary | ICD-10-CM

## 2021-11-25 DIAGNOSIS — O10913 Unspecified pre-existing hypertension complicating pregnancy, third trimester: Secondary | ICD-10-CM | POA: Insufficient documentation

## 2021-11-25 DIAGNOSIS — O10013 Pre-existing essential hypertension complicating pregnancy, third trimester: Secondary | ICD-10-CM | POA: Diagnosis not present

## 2021-11-25 DIAGNOSIS — O99210 Obesity complicating pregnancy, unspecified trimester: Secondary | ICD-10-CM | POA: Diagnosis not present

## 2021-11-25 DIAGNOSIS — O24415 Gestational diabetes mellitus in pregnancy, controlled by oral hypoglycemic drugs: Secondary | ICD-10-CM | POA: Insufficient documentation

## 2021-11-25 DIAGNOSIS — O24419 Gestational diabetes mellitus in pregnancy, unspecified control: Secondary | ICD-10-CM

## 2021-11-25 DIAGNOSIS — O0991 Supervision of high risk pregnancy, unspecified, first trimester: Secondary | ICD-10-CM

## 2021-11-25 DIAGNOSIS — O99213 Obesity complicating pregnancy, third trimester: Secondary | ICD-10-CM | POA: Insufficient documentation

## 2021-12-02 ENCOUNTER — Ambulatory Visit: Payer: Medicaid Other | Attending: Obstetrics and Gynecology

## 2021-12-02 ENCOUNTER — Ambulatory Visit: Payer: Medicaid Other | Admitting: *Deleted

## 2021-12-02 VITALS — BP 133/77 | HR 113

## 2021-12-02 DIAGNOSIS — O99210 Obesity complicating pregnancy, unspecified trimester: Secondary | ICD-10-CM

## 2021-12-02 DIAGNOSIS — O10919 Unspecified pre-existing hypertension complicating pregnancy, unspecified trimester: Secondary | ICD-10-CM | POA: Insufficient documentation

## 2021-12-02 DIAGNOSIS — O10913 Unspecified pre-existing hypertension complicating pregnancy, third trimester: Secondary | ICD-10-CM | POA: Diagnosis not present

## 2021-12-02 DIAGNOSIS — O99213 Obesity complicating pregnancy, third trimester: Secondary | ICD-10-CM

## 2021-12-02 DIAGNOSIS — O24415 Gestational diabetes mellitus in pregnancy, controlled by oral hypoglycemic drugs: Secondary | ICD-10-CM | POA: Diagnosis not present

## 2021-12-02 DIAGNOSIS — O0991 Supervision of high risk pregnancy, unspecified, first trimester: Secondary | ICD-10-CM | POA: Insufficient documentation

## 2021-12-02 DIAGNOSIS — Z3A36 36 weeks gestation of pregnancy: Secondary | ICD-10-CM | POA: Diagnosis not present

## 2021-12-02 DIAGNOSIS — O10013 Pre-existing essential hypertension complicating pregnancy, third trimester: Secondary | ICD-10-CM | POA: Diagnosis not present

## 2021-12-03 DIAGNOSIS — Z419 Encounter for procedure for purposes other than remedying health state, unspecified: Secondary | ICD-10-CM | POA: Diagnosis not present

## 2021-12-08 ENCOUNTER — Other Ambulatory Visit (HOSPITAL_COMMUNITY)
Admission: RE | Admit: 2021-12-08 | Discharge: 2021-12-08 | Disposition: A | Discharge status: Home / Self Care | Payer: Medicaid Other | Source: Ambulatory Visit | Attending: Obstetrics and Gynecology | Admitting: Obstetrics and Gynecology

## 2021-12-08 ENCOUNTER — Ambulatory Visit (INDEPENDENT_AMBULATORY_CARE_PROVIDER_SITE_OTHER): Payer: Medicaid Other | Admitting: Obstetrics & Gynecology

## 2021-12-08 ENCOUNTER — Encounter (HOSPITAL_COMMUNITY): Payer: Self-pay | Admitting: *Deleted

## 2021-12-08 ENCOUNTER — Telehealth (HOSPITAL_COMMUNITY): Payer: Self-pay | Admitting: *Deleted

## 2021-12-08 ENCOUNTER — Encounter: Payer: Self-pay | Admitting: Obstetrics & Gynecology

## 2021-12-08 VITALS — BP 146/92 | HR 114 | Wt 194.5 lb

## 2021-12-08 DIAGNOSIS — O10913 Unspecified pre-existing hypertension complicating pregnancy, third trimester: Secondary | ICD-10-CM | POA: Diagnosis not present

## 2021-12-08 DIAGNOSIS — O10919 Unspecified pre-existing hypertension complicating pregnancy, unspecified trimester: Secondary | ICD-10-CM

## 2021-12-08 DIAGNOSIS — Z3A36 36 weeks gestation of pregnancy: Secondary | ICD-10-CM | POA: Insufficient documentation

## 2021-12-08 DIAGNOSIS — O0993 Supervision of high risk pregnancy, unspecified, third trimester: Secondary | ICD-10-CM

## 2021-12-08 DIAGNOSIS — O0991 Supervision of high risk pregnancy, unspecified, first trimester: Secondary | ICD-10-CM

## 2021-12-08 DIAGNOSIS — Z3493 Encounter for supervision of normal pregnancy, unspecified, third trimester: Secondary | ICD-10-CM | POA: Diagnosis not present

## 2021-12-08 DIAGNOSIS — O24415 Gestational diabetes mellitus in pregnancy, controlled by oral hypoglycemic drugs: Secondary | ICD-10-CM

## 2021-12-08 NOTE — Progress Notes (Signed)
Presents for ROB at 37.1weeks. GBS and GC/CC collected today.  BP elevated 146/92, endorses ankle swelling. Denies headache, vision change.

## 2021-12-08 NOTE — Telephone Encounter (Signed)
Preadmission screen  

## 2021-12-08 NOTE — Progress Notes (Signed)
PRENATAL VISIT NOTE  Subjective:  Brandi Sanders is a 36 y.o. G3P2002 at [redacted]w[redacted]d being seen today for ongoing prenatal care.  She is currently monitored for the following issues for this high-risk pregnancy and has MIGRAINE, UNSPEC., W/O INTRACTABLE MIGRAINE; ALLERGIC RHINITIS, SEASONAL; HIDRADENITIS; Unspecified episodic mood disorder; Obesity; Essential hypertension, benign; Infertility, female; Moderate episode of recurrent major depressive disorder ([place]); Alcohol abuse with alcohol-induced mood disorder ([place]); No-show for appointment; Elevated blood pressure reading; Tobacco use; Supervision of high risk pregnancy in first trimester; Chronic hypertension during pregnancy, antepartum; Maternal obesity affecting pregnancy, antepartum; and Gestational diabetes mellitus (GDM) controlled on oral hypoglycemic drug, antepartum on their problem list.  Patient reports no complaints. Patient denies any headaches, visual symptoms, RUQ/epigastric pain or other concerning symptoms. Contractions: Not present. Vag. Bleeding: None.  Movement: Present. Denies leaking of fluid.   The following portions of the patient's history were reviewed and updated as appropriate: allergies, current medications, past family history, past medical history, past social history, past surgical history and problem list.   Objective:   Vitals:   12/08/21 0905  BP: (!) 146/92  Pulse: (!) 114  Weight: 194 lb 8 oz (88.2 kg)    Fetal Status: Fetal Heart Rate (bpm): 135   Movement: Present     General:  Alert, oriented and cooperative. Patient is in no acute distress.  Skin: Skin is warm and dry. No rash noted.   Cardiovascular: Normal heart rate noted  Respiratory: Normal respiratory effort, no problems with respiration noted  Abdomen: Soft, gravid, appropriate for gestational age.  Pain/Pressure: Absent     Pelvic: Cultures obtained, cervical exam deferred. Performed in the presence of a chaperone        Extremities:  Normal range of motion.     Mental Status: Normal mood and affect. Normal behavior. Normal judgment and thought content.   [place] MFM FETAL BPP WO NON STRESS  Result Date: 12/02/2021 ----------------------------------------------------------------------  OBSTETRICS REPORT                    (Corrected Final 12/02/2021 11:52 am) ---------------------------------------------------------------------- Patient Info  ID #:       [id number]                          D.O.B.:  [birth year] (35 yrs)  Name:       Brandi Sanders                Visit Date: 11/25/2021 07:56 am ---------------------------------------------------------------------- Performed By  Attending:        Valeda Malm DO       Secondary Phy.:   PEGGY                                                             CONSTANT MD  Performed By:     Jeanene Erb BS,      Address:          Faculty                    RDMS  Referred By:      Mobile             Location[facility]                                                             [facility]                                                             Women  Ref. Address:     [address]                    [place]                    [place] ---------------------------------------------------------------------- Orders  #  Description                           Code        Ordered By  1  [place] MFM FETAL BPP WO NON               76819.01    RAVI SHANKAR     STRESS  2  [place] MFM OB FOLLOW UP                   [id number]    RAVI [facility] ----------------------------------------------------------------------  #  Order #                     Accession #                Episode #  1  [id number]                   [id number]                 [id number]  2  [id number]                   [id number]                 [id number]  ---------------------------------------------------------------------- Indications  Gestational diabetes in pregnancy,             O24.415  controlled by oral hypoglycemic drugs  (metformin)  Hypertension - Chronic/Pre-existing            O10.019  LR NIPS/ Neg AFP/ Neg Horizon  [redacted] weeks gestation of pregnancy                Z3A.35 ---------------------------------------------------------------------- Vital Signs  BP:  128/78 ---------------------------------------------------------------------- Fetal Evaluation  Num Of Fetuses:         1  Fetal Heart Rate(bpm):  148  Cardiac Activity:       Observed  Presentation:           Cephalic  Placenta:               Left lateral  P. Cord Insertion:      Previously Visualized  Amniotic Fluid  AFI FV:      Within normal limits  AFI Sum(cm)     %Tile       Largest Pocket(cm)  11.98           36          4.4  RUQ(cm)       RLQ(cm)       LUQ(cm)        LLQ(cm)  2.3           2.3           4.4            2.98 ---------------------------------------------------------------------- Biophysical Evaluation  Amniotic F.V:   Pocket => 2 cm             F. Tone:        Observed  F. Movement:    Observed                   Score:          8/8  F. Breathing:   Observed ---------------------------------------------------------------------- Biometry  BPD:     89.55  mm     G. Age:  36w 2d         80  %    CI:        78.91   %    70 - 86                                                          FL/HC:      21.4   %    20.1 - 22.3  HC:    318.76   mm     G. Age:  35w 6d         31  %    HC/AC:      0.94        0.93 - 1.11  AC:    339.22   mm     G. Age:  37w 6d         98  %    FL/BPD:     76.2   %    71 - 87  FL:      68.27  mm     G. Age:  35w 0d         37  %    FL/AC:      20.1   %    20 - 24  Est. FW:    3028  gm    6 lb 11 oz      86  % ---------------------------------------------------------------------- OB History  Blood Type:   O+  Gravidity:    3         Term:   2  Living:        2 ---------------------------------------------------------------------- Gestational Age  LMP:  35w 2d        Date:  03/23/21                 EDD:   12/28/21  U/S Today:     36w 2d                                        EDD:   12/21/21  Best:          35w 2d     Det. By:  LMP  (03/23/21)          EDD:   12/28/21 ---------------------------------------------------------------------- Anatomy  Cranium:               Previously seen        Aortic Arch:            Previously seen  Cavum:                 Previously seen        Ductal Arch:            Previously seen  Ventricles:            Appears normal         Diaphragm:              Appears normal  Choroid Plexus:        Previously seen        Stomach:                Appears normal, left                                                                        sided  Cerebellum:            Previously seen        Abdomen:                Previously seen  Posterior Fossa:       Previously seen        Abdominal Wall:         Previously seen  Nuchal Fold:           Previously seen        Cord Vessels:           Previously seen  Face:                  Orbits and profile     Kidneys:                Appear normal                         previously seen  Lips:                  Previously seen        Bladder:                Appears normal  Thoracic:              Previously seen        Spine:  Previously seen  Heart:                 Appears normal         Upper Extremities:      Previously seen  RVOT:                  Previously seen        Lower Extremities:      Previously seen  LVOT:                  Previously seen  Other:  Heels/feet visualized previously . Fetus appears to be female. ---------------------------------------------------------------------- Targeted Anatomy  Central Nervous System  Midline Falx:          Previously seen  Head/Neck  Nasal Bone:            Previously seen        Maxilla:                Previously seen  Mandible:               Previously seen  Thorax  3 Vessel View:         Previously seen        IVC:                    Previously seen  3 V Trachea View:      Previously seen ---------------------------------------------------------------------- Cervix Uterus Adnexa  Cervix  Not visualized (advanced GA >24wks) ---------------------------------------------------------------------- Comments  The patient is here for a a BPP for gDMA2 and CHTN.  Sonographic findings  Single intrauterine pregnancy at at  35w 2d.  Observed fetal cardiac activity.  Cephalic presentation.  Interval fetal anatomy appears normal.  Fetal biometry shows the estimated fetal weight at the 86  percentile.  Amniotic fluid volume: Within normal limits. AFI: 11.98 cm.  MVP: 4.4 cm.  Placenta is Left lateral.  BPP is 8/8.  Recommendations  1. Continue weekly BBPs and q4w growth [place] until delivery  2. Delivery at 37-39w or sooner if indicated ----------------------------------------------------------------------                       Valeda Malm, DO Electronically Signed Corrected Final Report  12/02/2021 11:52 am ----------------------------------------------------------------------  [place] MFM OB FOLLOW UP  Result Date: 12/02/2021 ----------------------------------------------------------------------  OBSTETRICS REPORT                    (Corrected Final 12/02/2021 11:52 am) ---------------------------------------------------------------------- Patient Info  ID #:       [id number]                          D.O.B.:  09-06-1985 (35 yrs)  Name:       Brandi Sanders                Visit Date: 11/25/2021 07:56 am ---------------------------------------------------------------------- Performed By  Attending:        Valeda Malm DO       Secondary Phy.:   PEGGY                                                             CONSTANT MD  Performed By:  Jeanene Erb BS,      Address:          Faculty                    RDMS  Referred By:      Jerrye Bushy             Location:          [facility]                                                             [place] for                                                             Women  Ref. Address:     [place]                    [place]                    [place] ---------------------------------------------------------------------- Orders  #  Description                           Code        Ordered By  1  [place] MFM FETAL BPP WO NON               76819.01    RAVI SHANKAR     STRESS  2  [place] MFM OB FOLLOW UP                   [id number]    RAVI [facility] ----------------------------------------------------------------------  #  Order #                     Accession #                Episode #  1  [id number]                   [id number]                 [id number]  2  [id number]                   [id number]                 [id number] ---------------------------------------------------------------------- Indications  Gestational diabetes in pregnancy,  O24.415  controlled by oral hypoglycemic drugs  (metformin)  Hypertension - Chronic/Pre-existing            O10.019  LR NIPS/ Neg AFP/ Neg Horizon  [redacted] weeks gestation of pregnancy                Z3A.35 ---------------------------------------------------------------------- Vital Signs  BP:          128/78 ---------------------------------------------------------------------- Fetal Evaluation  Num Of Fetuses:         1  Fetal Heart Rate(bpm):  148  Cardiac Activity:       Observed  Presentation:           Cephalic  Placenta:               Left lateral  P. Cord Insertion:      Previously Visualized  Amniotic Fluid  AFI FV:      Within normal limits  AFI Sum(cm)     %Tile       Largest Pocket(cm)  11.98           36          4.4  RUQ(cm)       RLQ(cm)       LUQ(cm)        LLQ(cm)  2.3           2.3           4.4            2.98  ---------------------------------------------------------------------- Biophysical Evaluation  Amniotic F.V:   Pocket => 2 cm             F. Tone:        Observed  F. Movement:    Observed                   Score:          8/8  F. Breathing:   Observed ---------------------------------------------------------------------- Biometry  BPD:     89.55  mm     G. Age:  36w 2d         80  %    CI:        78.91   %    70 - 86                                                          FL/HC:      21.4   %    20.1 - 22.3  HC:    318.76   mm     G. Age:  35w 6d         31  %    HC/AC:      0.94        0.93 - 1.11  AC:    339.22   mm     G. Age:  37w 6d         98  %    FL/BPD:     76.2   %    71 - 87  FL:      68.27  mm     G. Age:  35w 0d         37  %    FL/AC:      20.1   %  20 - 24  Est. FW:    3028  gm    6 lb 11 oz      86  % ---------------------------------------------------------------------- OB History  Blood Type:   O+  Gravidity:    3         Term:   2  Living:       2 ---------------------------------------------------------------------- Gestational Age  LMP:           35w 2d        Date:  03/23/21                 EDD:   12/28/21  U/S Today:     36w 2d                                        EDD:   12/21/21  Best:          35w 2d     Det. By:  LMP  (03/23/21)          EDD:   12/28/21 ---------------------------------------------------------------------- Anatomy  Cranium:               Previously seen        Aortic Arch:            Previously seen  Cavum:                 Previously seen        Ductal Arch:            Previously seen  Ventricles:            Appears normal         Diaphragm:              Appears normal  Choroid Plexus:        Previously seen        Stomach:                Appears normal, left                                                                        sided  Cerebellum:            Previously seen        Abdomen:                Previously seen  Posterior Fossa:       Previously seen         Abdominal Wall:         Previously seen  Nuchal Fold:           Previously seen        Cord Vessels:           Previously seen  Face:                  Orbits and profile     Kidneys:                Appear normal  previously seen  Lips:                  Previously seen        Bladder:                Appears normal  Thoracic:              Previously seen        Spine:                  Previously seen  Heart:                 Appears normal         Upper Extremities:      Previously seen  RVOT:                  Previously seen        Lower Extremities:      Previously seen  LVOT:                  Previously seen  Other:  Heels/feet visualized previously . Fetus appears to be female. ---------------------------------------------------------------------- Targeted Anatomy  Central Nervous System  Midline Falx:          Previously seen  Head/Neck  Nasal Bone:            Previously seen        Maxilla:                Previously seen  Mandible:              Previously seen  Thorax  3 Vessel View:         Previously seen        IVC:                    Previously seen  3 V Trachea View:      Previously seen ---------------------------------------------------------------------- Cervix Uterus Adnexa  Cervix  Not visualized (advanced GA >24wks) ---------------------------------------------------------------------- Comments  The patient is here for a a BPP for gDMA2 and CHTN.  Sonographic findings  Single intrauterine pregnancy at at  35w 2d.  Observed fetal cardiac activity.  Cephalic presentation.  Interval fetal anatomy appears normal.  Fetal biometry shows the estimated fetal weight at the 86  percentile.  Amniotic fluid volume: Within normal limits. AFI: 11.98 cm.  MVP: 4.4 cm.  Placenta is Left lateral.  BPP is 8/8.  Recommendations  1. Continue weekly BBPs and q4w growth [place] until delivery  2. Delivery at 37-39w or sooner if indicated  ----------------------------------------------------------------------                       Valeda Malm, DO Electronically Signed Corrected Final Report  12/02/2021 11:52 am ----------------------------------------------------------------------  [place] MFM FETAL BPP WO NON STRESS  Result Date: 12/02/2021 ----------------------------------------------------------------------  OBSTETRICS REPORT                       (Signed Final 12/02/2021 08:28 am) ---------------------------------------------------------------------- Patient Info  ID #:       [id number]                          D.O.B.:  Jul 07, 1985 (35 yrs)  Name:       Brandi Sanders                Visit Date: 12/02/2021  07:29 am ---------------------------------------------------------------------- Performed By  Attending:        Tama High MD        Secondary Phy.:   Vickii Chafe                                                             CONSTANT MD  Performed By:     Nevin Bloodgood          Address:          Faculty                    RDMS  Referred By:      Jerrye Bushy             Location:         [facility]                                                             [place] for                                                             Women  Ref. Address:     [place]                    [place]                    [place] ---------------------------------------------------------------------- Orders  #  Description                           Code        Ordered By  1  [place] MFM FETAL BPP WO NON               [id number]    RAVI [facility]     STRESS ----------------------------------------------------------------------  #  Order #                     Accession #                Episode #  1  [id number]                   [id number]  [id number] ----------------------------------------------------------------------  Indications  Gestational diabetes in pregnancy,             O24.415  controlled by oral hypoglycemic drugs  (metformin)  Hypertension - Chronic/Pre-existing            O10.019  [redacted] weeks gestation of pregnancy                Z3A.36  LR NIPS/ Neg AFP/ Neg Horizon ---------------------------------------------------------------------- Fetal Evaluation  Num Of Fetuses:         1  Fetal Heart Rate(bpm):  144  Cardiac Activity:       Observed  Presentation:           Cephalic  Placenta:               Left lateral  P. Cord Insertion:      Previously Visualized  Amniotic Fluid  AFI FV:      Within normal limits  AFI Sum(cm)     %Tile       Largest Pocket(cm)  15.83           59          7.03  RUQ(cm)       RLQ(cm)       LUQ(cm)        LLQ(cm)  3.96          1.79          3.05           7.03 ---------------------------------------------------------------------- Biophysical Evaluation  Amniotic F.V:   Pocket => 2 cm             F. Tone:        Observed  F. Movement:    Observed                   Score:          8/8  F. Breathing:   Observed ---------------------------------------------------------------------- Biometry  LV:        4.3  mm ---------------------------------------------------------------------- OB History  Blood Type:   O+  Gravidity:    3         Term:   2  Living:       2 ---------------------------------------------------------------------- Gestational Age  LMP:           36w 2d        Date:  03/23/21                 EDD:   12/28/21  Best:          [place] 2d     Det. By:  LMP  (03/23/21)          EDD:   12/28/21 ---------------------------------------------------------------------- Anatomy  Cranium:               Appears normal         LVOT:                   Previously seen  Cavum:                 Previously seen        Aortic Arch:            Previously seen  Ventricles:            Appears normal         Ductal Arch:            Previously seen  Choroid Plexus:  Previously seen        Diaphragm:               Appears normal  Cerebellum:            Previously seen        Stomach:                Appears normal, left                                                                        sided  Posterior Fossa:       Previously seen        Abdomen:                Previously seen  Nuchal Fold:           Not applicable ([id number]    Abdominal Wall:         Previously seen                         wks GA)  Face:                  Orbits and profile     Cord Vessels:           Previously seen                         previously seen  Lips:                  Previously seen        Kidneys:                Appear normal  Palate:                Not well visualized    Bladder:                Appears normal  Thoracic:              Previously seen        Spine:                  Previously seen  Heart:                 Appears normal         Upper Extremities:      Previously seen  RVOT:                  Previously seen        Lower Extremities:      Previously seen  Other:  Heels/feet visualized previously . Female gender previously seen. ---------------------------------------------------------------------- Cervix Uterus Adnexa  Cervix  Not visualized (advanced GA >24wks)  Uterus  No abnormality visualized.  Right Ovary  Within normal limits.  Left Ovary  Within normal limits.  Cul De Sac  No free fluid seen.  Adnexa  No abnormality visualized. ---------------------------------------------------------------------- Impression  Gestational diabetes.  Patient takes metformin for control.  Amniotic fluid is normal and good fetal activity seen.  Antenatal testing is reassuring.  BPP 8/8. Cephalic  presentation.  Blood pressure 133/77 mmHg. ---------------------------------------------------------------------- Recommendations  -  Continue weekly BPP till delivery. ----------------------------------------------------------------------                 Tama High, MD Electronically Signed Final Report   12/02/2021 08:28 am  ----------------------------------------------------------------------  [place] MFM FETAL BPP WO NON STRESS  Result Date: 11/18/2021 ----------------------------------------------------------------------  OBSTETRICS REPORT                       (Signed Final 11/18/2021 08:07 am) ---------------------------------------------------------------------- Patient Info  ID #:       [id number]                          D.O.B.:  [birth year] (35 yrs)  Name:       Brandi Sanders                Visit Date: 11/18/2021 07:22 am ---------------------------------------------------------------------- Performed By  Attending:        Tama High MD        Secondary Phy.:   Vickii Chafe                                                             CONSTANT MD  Performed By:     Rolm Bookbinder RDMS     Address:          Faculty  Referred By:      [place]             Location:         [facility]                                                             [place] for                                                             Women  Ref. Address:     [place]                    [place]                    [place] ---------------------------------------------------------------------- Orders  #  Description  Code        Ordered By  1  [place] MFM FETAL BPP WO NON               [id number]    RAVI [facility]     STRESS ----------------------------------------------------------------------  #  Order #                     Accession #                Episode #  1  [id number]                   [id number]                 [id number] ---------------------------------------------------------------------- Indications  [redacted] weeks gestation of pregnancy                Z3A.34  Gestational diabetes in pregnancy,             O24.415  controlled by oral hypoglycemic drugs  (metformin)  Hypertension -  Chronic/Pre-existing            O10.019  [redacted] weeks gestation of pregnancy                Z3A.33  LR NIPS/ Neg AFP/ Neg Horizon ---------------------------------------------------------------------- Vital Signs                            Pulse:  120  BP:          120/75 ---------------------------------------------------------------------- Fetal Evaluation  Num Of Fetuses:         1  Fetal Heart Rate(bpm):  157  Cardiac Activity:       Observed  Presentation:           Cephalic  Placenta:               Left lateral  P. Cord Insertion:      Previously Visualized  Amniotic Fluid  AFI FV:      Within normal limits  AFI Sum(cm)     %Tile       Largest Pocket(cm)  11.87           33          5.32  RUQ(cm)       RLQ(cm)       LUQ(cm)        LLQ(cm)  2.74          5.32          0              3.81 ---------------------------------------------------------------------- Biophysical Evaluation  Amniotic F.V:   Within normal limits       F. Tone:        Observed  F. Movement:    Observed                   Score:          8/8  F. Breathing:   Observed ---------------------------------------------------------------------- OB History  Blood Type:   O+  Gravidity:    3         Term:   2  Living:       2 ---------------------------------------------------------------------- Gestational Age  LMP:           34w 2d        Date:  03/23/21  EDD:   12/28/21  Best:          34w 2d     Det. By:  LMP  (03/23/21)          EDD:   12/28/21 ---------------------------------------------------------------------- Anatomy  Cranium:               Previously seen        Aortic Arch:            Previously seen  Cavum:                 Previously seen        Ductal Arch:            Previously seen  Ventricles:            Appears normal         Diaphragm:              Appears normal  Choroid Plexus:        Previously seen        Stomach:                Appears normal, left                                                                         sided  Cerebellum:            Previously seen        Abdomen:                Previously seen  Posterior Fossa:       Previously seen        Abdominal Wall:         Previously seen  Nuchal Fold:           Previously seen        Cord Vessels:           Previously seen  Face:                  Orbits and profile     Kidneys:                Appear normal                         previously seen  Lips:                  Previously seen        Bladder:                Appears normal  Thoracic:              Previously seen        Spine:                  Previously seen  Heart:                 Appears normal         Upper Extremities:      Previously seen  RVOT:                  Previously seen  Lower Extremities:      Previously seen  LVOT:                  Previously seen  Other:  VC, 3VV,  3VTV, nasal bone, maxilla, mandible, falx, and heels/feet          visualized previously . Fetus appears to be female. ---------------------------------------------------------------------- Impression  Gestational diabetes.  Patient takes metformin for control.  Amniotic fluid is normal and good fetal activity seen.  Antenatal testing is reassuring.  BPP 8/8.  Blood pressure 120/75 mmHg. ---------------------------------------------------------------------- Recommendations  -Continue weekly BPP till delivery. ----------------------------------------------------------------------                 Tama High, MD Electronically Signed Final Report   11/18/2021 08:07 am ----------------------------------------------------------------------  [place] MFM FETAL BPP WO NON STRESS  Result Date: 11/12/2021 ----------------------------------------------------------------------  OBSTETRICS REPORT                       (Signed Final 11/12/2021 01:57 pm) ---------------------------------------------------------------------- Patient Info  ID #:       [id number]                          D.O.B.:  04-22-1985 (35 yrs)  Name:       Brandi Sanders                 Visit Date: 11/12/2021 09:36 am ---------------------------------------------------------------------- Performed By  Attending:        Johnell Comings MD         Secondary Phy.:   Vickii Chafe                                                             CONSTANT MD  Performed By:     Stephenie Acres        Address:          Faculty                    BS RDMS  Referred By:      [employer]             Location[facility]                                                             [place] [facility]  Ref. Address:     [address]                    [address]                    [place]                    [address] ---------------------------------------------------------------------- Orders  #  Description                           Code        Ordered By  1  [place] MFM FETAL BPP WO NON               [phone]    RAVI [facility]     STRESS ----------------------------------------------------------------------  #  Order #                     Accession #                Episode #  1  [id number]                   [id number]                 [id number] ---------------------------------------------------------------------- Indications  Gestational diabetes in pregnancy,             O24.415  controlled by oral hypoglycemic drugs  (metformin)  Hypertension - Chronic/Pre-existing            O10.019  [redacted] weeks gestation of pregnancy                Z3A.33  LR NIPS/ Neg AFP/ Neg Horizon ---------------------------------------------------------------------- Fetal Evaluation  Num Of Fetuses:         1  Fetal Heart Rate(bpm):  146  Cardiac Activity:       Observed  Presentation:           Cephalic  Placenta:               Left lateral  P. Cord Insertion:      Previously Visualized  Amniotic Fluid  AFI FV:      Within normal limits  AFI Sum(cm)     %Tile       Largest  Pocket(cm)  15.85           57          5.43  RUQ(cm)       RLQ(cm)       LUQ(cm)        LLQ(cm)  5.43          5.14          3              2.28 ---------------------------------------------------------------------- Biophysical Evaluation  Amniotic F.V:   Within normal limits       F. Tone:        Observed  F. Movement:    Observed                   Score:          8/8  F. Breathing:   Observed ---------------------------------------------------------------------- Biometry  LV:        4.1  mm ---------------------------------------------------------------------- OB  History  Blood Type:   O+  Gravidity:    3         Term:   2  Living:       2 ---------------------------------------------------------------------- Gestational Age  LMP:           33w 3d        Date:  03/23/21                 EDD:   12/28/21  Best:          33w 3d     Det. By:  LMP  (03/23/21)          EDD:   12/28/21 ---------------------------------------------------------------------- Anatomy  Cranium:               Previously seen        Aortic Arch:            Previously seen  Cavum:                 Previously seen        Ductal Arch:            Previously seen  Ventricles:            Appears normal         Diaphragm:              Appears normal  Choroid Plexus:        Previously seen        Stomach:                Appears normal, left                                                                        sided  Cerebellum:            Previously seen        Abdomen:                Previously seen  Posterior Fossa:       Previously seen        Abdominal Wall:         Previously seen  Nuchal Fold:           Previously seen        Cord Vessels:           Previously seen  Face:                  Orbits and profile     Kidneys:                Appear normal                         previously seen  Lips:                  Previously seen        Bladder:                Appears normal  Thoracic:              Previously seen        Spine:  Previously seen  Heart:                 Appears normal; EIF    Upper Extremities:      Previously seen  RVOT:                  Previously seen        Lower Extremities:      Previously seen  LVOT:                  Previously seen  Other:  VC, 3VV,  3VTV, nasal bone, maxilla, mandible, falx, and heels/feet          visualized previously . Fetus appears to be female. ---------------------------------------------------------------------- Cervix Uterus Adnexa  Cervix  Not visualized (advanced GA >24wks)  Uterus  No abnormality visualized.  Right Ovary  Within normal limits.  Left Ovary  Within normal limits.  Cul De Sac  No free fluid seen.  Adnexa  No abnormality visualized. ---------------------------------------------------------------------- Comments  This patient was seen for a BPP due to gestational diabetes  treated with metformin and chronic hypertension.  She denies  any problems since her last exam.  A biophysical profile performed today was 8 out of 8.  There was normal amniotic fluid noted on today's ultrasound  exam.  She will return in 1 week for another BPP. ----------------------------------------------------------------------                   Johnell Comings, MD Electronically Signed Final Report   11/12/2021 01:57 pm ----------------------------------------------------------------------   Assessment and Plan:  Pregnancy: [id number] at [redacted]w[redacted]d 1. Chronic hypertension during pregnancy, antepartum Will check labs today. Continue weekly BPP as per MFM. IOL scheduled at [redacted]w[redacted]d after discussion with patient, offered 37 weeks but she declined. Advised strict BP checks daily at home, coming in for worsening BP or symptoms. Also told that if labs are concerning, she may need delivery earlier. - CBC - Comprehensive metabolic panel - Protein / creatinine ratio, urine  2. Gestational diabetes mellitus (GDM) controlled on oral hypoglycemic drug, antepartum Reviewed CBGs on phone, mostly within limits. Continue  Metformin. IOL scheduled at 38 weeks.   3. [redacted] weeks gestation of pregnancy 4. Supervision of high risk pregnancy in first trimester Pelvic cultures done, will follow up results and manage accordingly. - Culture, beta strep (group b only) - Cervicovaginal ancillary only( [place]) Preterm labor symptoms and general obstetric precautions including but not limited to vaginal bleeding, contractions, leaking of fluid and fetal movement were reviewed in detail with the patient. Please refer to After Visit Summary for other counseling recommendations.   Return for Postpartum check (IOL on 12/16/21).  Future Appointments  Date Time Provider [facility]  12/09/2021  7:45 AM [facility] NURSE [facility]  12/09/2021  8:00 AM [facility] US1 [facility]  12/14/2021  8:55 AM Woodroe Mode, MD [facility] None  12/16/2021  7:15 AM [facility] NURSE [facility]  12/16/2021  7:30 AM [facility] US3 [facility]  12/21/2021  9:35 AM Woodroe Mode, MD [facility] None  12/28/2021  8:55 AM Constant, Vickii Chafe, MD [facility] None    Verita Schneiders, MD

## 2021-12-08 NOTE — Patient Instructions (Signed)
Induction scheduled 12/16/2021 at midnight, come to hospital on 12/15/2021 at 11:45 pm  Return to office for any scheduled appointments. Call the office or go to the MAU at [facility] if: You begin to have strong, frequent contractions Your water breaks.  Sometimes it is a big gush of fluid, sometimes it is just a trickle that keeps getting your underwear wet or running down your legs You have vaginal bleeding.  It is normal to have a small amount of spotting if your cervix was checked.  You do not feel your baby moving like normal.  If you do not, get something to eat and drink and lay down and focus on feeling your baby move.   If your baby is still not moving like normal, you should call the office or go to MAU. Any other obstetric concerns.

## 2021-12-09 ENCOUNTER — Ambulatory Visit: Payer: Medicaid Other | Admitting: *Deleted

## 2021-12-09 ENCOUNTER — Ambulatory Visit: Payer: Medicaid Other | Attending: Maternal & Fetal Medicine

## 2021-12-09 VITALS — BP 117/84 | HR 107

## 2021-12-09 DIAGNOSIS — O99210 Obesity complicating pregnancy, unspecified trimester: Secondary | ICD-10-CM | POA: Insufficient documentation

## 2021-12-09 DIAGNOSIS — O0991 Supervision of high risk pregnancy, unspecified, first trimester: Secondary | ICD-10-CM

## 2021-12-09 DIAGNOSIS — O10913 Unspecified pre-existing hypertension complicating pregnancy, third trimester: Secondary | ICD-10-CM | POA: Insufficient documentation

## 2021-12-09 DIAGNOSIS — O24419 Gestational diabetes mellitus in pregnancy, unspecified control: Secondary | ICD-10-CM | POA: Insufficient documentation

## 2021-12-09 DIAGNOSIS — O10919 Unspecified pre-existing hypertension complicating pregnancy, unspecified trimester: Secondary | ICD-10-CM | POA: Diagnosis not present

## 2021-12-09 DIAGNOSIS — O24415 Gestational diabetes mellitus in pregnancy, controlled by oral hypoglycemic drugs: Secondary | ICD-10-CM | POA: Diagnosis not present

## 2021-12-09 DIAGNOSIS — Z3A37 37 weeks gestation of pregnancy: Secondary | ICD-10-CM | POA: Diagnosis not present

## 2021-12-09 DIAGNOSIS — O10013 Pre-existing essential hypertension complicating pregnancy, third trimester: Secondary | ICD-10-CM

## 2021-12-09 LAB — CERVICOVAGINAL ANCILLARY ONLY
Chlamydia: NEGATIVE
Comment: NEGATIVE
Comment: NORMAL
Neisseria Gonorrhea: NEGATIVE

## 2021-12-10 LAB — COMPREHENSIVE METABOLIC PANEL
ALT: 7 IU/L (ref 0–32)
AST: 27 IU/L (ref 0–40)
Albumin/Globulin Ratio: 1.3 (ref 1.2–2.2)
Albumin: 3.7 g/dL — ABNORMAL LOW (ref 3.9–4.9)
Alkaline Phosphatase: 158 IU/L — ABNORMAL HIGH (ref 44–121)
BUN/Creatinine Ratio: 8 — ABNORMAL LOW (ref 9–23)
BUN: 4 mg/dL — ABNORMAL LOW (ref 6–20)
Bilirubin Total: <0.2 mg/dL (ref 0.0–1.2)
CO2: 19 mmol/L — ABNORMAL LOW (ref 20–29)
Calcium: 9.4 mg/dL (ref 8.7–10.2)
Chloride: 99 mmol/L (ref 96–106)
Creatinine, Ser: 0.53 mg/dL — ABNORMAL LOW (ref 0.57–1.00)
Globulin, Total: 2.9 g/dL (ref 1.5–4.5)
Glucose: 89 mg/dL (ref 70–99)
Potassium: 4.1 mmol/L (ref 3.5–5.2)
Sodium: 134 mmol/L (ref 134–144)
Total Protein: 6.6 g/dL (ref 6.0–8.5)
eGFR: 124 mL/min/{1.73_m2} (ref >59)

## 2021-12-10 LAB — PROTEIN / CREATININE RATIO, URINE
Creatinine, Urine: 43.1 mg/dL
Protein, Ur: 75.1 mg/dL
Protein/Creat Ratio: 1742 mg/g creat — ABNORMAL HIGH (ref 0–200)

## 2021-12-10 LAB — CBC
Hematocrit: 33.9 % — ABNORMAL LOW (ref 34.0–46.6)
Hemoglobin: 11.7 g/dL (ref 11.1–15.9)
MCH: 30.7 pg (ref 26.6–33.0)
MCHC: 34.5 g/dL (ref 31.5–35.7)
MCV: 89 fL (ref 79–97)
Platelets: 194 10*3/uL (ref 150–450)
RBC: 3.81 x10E6/uL (ref 3.77–5.28)
RDW: 14 % (ref 11.7–15.4)
WBC: 8.6 10*3/uL (ref 3.4–10.8)

## 2021-12-12 LAB — CULTURE, BETA STREP (GROUP B ONLY): Strep Gp B Culture: NEGATIVE

## 2021-12-14 ENCOUNTER — Encounter (HOSPITAL_COMMUNITY): Payer: Self-pay | Admitting: Obstetrics & Gynecology

## 2021-12-14 ENCOUNTER — Encounter: Payer: Medicaid Other | Admitting: Obstetrics and Gynecology

## 2021-12-14 ENCOUNTER — Inpatient Hospital Stay (HOSPITAL_COMMUNITY)
Admission: AD | Admit: 2021-12-14 | Discharge: 2021-12-17 | DRG: 807 | Disposition: A | Discharge status: Home / Self Care | Payer: Medicaid Other | Attending: Obstetrics and Gynecology | Admitting: Obstetrics and Gynecology

## 2021-12-14 ENCOUNTER — Other Ambulatory Visit: Payer: Self-pay

## 2021-12-14 DIAGNOSIS — O99214 Obesity complicating childbirth: Secondary | ICD-10-CM | POA: Diagnosis not present

## 2021-12-14 DIAGNOSIS — O99210 Obesity complicating pregnancy, unspecified trimester: Secondary | ICD-10-CM | POA: Diagnosis present

## 2021-12-14 DIAGNOSIS — Z87891 Personal history of nicotine dependence: Secondary | ICD-10-CM

## 2021-12-14 DIAGNOSIS — O24415 Gestational diabetes mellitus in pregnancy, controlled by oral hypoglycemic drugs: Secondary | ICD-10-CM | POA: Diagnosis present

## 2021-12-14 DIAGNOSIS — F331 Major depressive disorder, recurrent, moderate: Secondary | ICD-10-CM | POA: Diagnosis present

## 2021-12-14 DIAGNOSIS — O1002 Pre-existing essential hypertension complicating childbirth: Secondary | ICD-10-CM | POA: Diagnosis not present

## 2021-12-14 DIAGNOSIS — O10919 Unspecified pre-existing hypertension complicating pregnancy, unspecified trimester: Secondary | ICD-10-CM | POA: Diagnosis present

## 2021-12-14 DIAGNOSIS — O24425 Gestational diabetes mellitus in childbirth, controlled by oral hypoglycemic drugs: Secondary | ICD-10-CM | POA: Diagnosis not present

## 2021-12-14 DIAGNOSIS — Z3A38 38 weeks gestation of pregnancy: Secondary | ICD-10-CM | POA: Diagnosis not present

## 2021-12-14 DIAGNOSIS — O114 Pre-existing hypertension with pre-eclampsia, complicating childbirth: Principal | ICD-10-CM | POA: Diagnosis present

## 2021-12-14 DIAGNOSIS — Z7982 Long term (current) use of aspirin: Secondary | ICD-10-CM

## 2021-12-14 DIAGNOSIS — O0991 Supervision of high risk pregnancy, unspecified, first trimester: Secondary | ICD-10-CM

## 2021-12-14 LAB — COMPREHENSIVE METABOLIC PANEL
ALT: 12 U/L (ref 0–44)
AST: 32 U/L (ref 15–41)
Albumin: 2.7 g/dL — ABNORMAL LOW (ref 3.5–5.0)
Alkaline Phosphatase: 132 U/L — ABNORMAL HIGH (ref 38–126)
Anion gap: 7 (ref 5–15)
BUN: 6 mg/dL (ref 6–20)
CO2: 20 mmol/L — ABNORMAL LOW (ref 22–32)
Calcium: 9.1 mg/dL (ref 8.9–10.3)
Chloride: 109 mmol/L (ref 98–111)
Creatinine, Ser: 0.59 mg/dL (ref 0.44–1.00)
GFR, Estimated: >60 mL/min (ref >60)
Glucose, Bld: 79 mg/dL (ref 70–99)
Potassium: 3.9 mmol/L (ref 3.5–5.1)
Sodium: 136 mmol/L (ref 135–145)
Total Bilirubin: 0.3 mg/dL (ref 0.3–1.2)
Total Protein: 6.4 g/dL — ABNORMAL LOW (ref 6.5–8.1)

## 2021-12-14 LAB — CBC WITH DIFFERENTIAL/PLATELET
Abs Immature Granulocytes: 0.06 10*3/uL (ref 0.00–0.07)
Basophils Absolute: 0 10*3/uL (ref 0.0–0.1)
Basophils Relative: 0 %
Eosinophils Absolute: 0.1 10*3/uL (ref 0.0–0.5)
Eosinophils Relative: 1 %
HCT: 32.9 % — ABNORMAL LOW (ref 36.0–46.0)
Hemoglobin: 11 g/dL — ABNORMAL LOW (ref 12.0–15.0)
Immature Granulocytes: 1 %
Lymphocytes Relative: 22 %
Lymphs Abs: 2 10*3/uL (ref 0.7–4.0)
MCH: 30.4 pg (ref 26.0–34.0)
MCHC: 33.4 g/dL (ref 30.0–36.0)
MCV: 90.9 fL (ref 80.0–100.0)
Monocytes Absolute: 0.6 10*3/uL (ref 0.1–1.0)
Monocytes Relative: 7 %
Neutro Abs: 6.3 10*3/uL (ref 1.7–7.7)
Neutrophils Relative %: 69 %
Platelets: 190 10*3/uL (ref 150–400)
RBC: 3.62 MIL/uL — ABNORMAL LOW (ref 3.87–5.11)
RDW: 15.3 % (ref 11.5–15.5)
WBC: 9.1 10*3/uL (ref 4.0–10.5)
nRBC: 0 % (ref 0.0–0.2)

## 2021-12-14 LAB — CBC
HCT: 32.2 % — ABNORMAL LOW (ref 36.0–46.0)
Hemoglobin: 10.9 g/dL — ABNORMAL LOW (ref 12.0–15.0)
MCH: 30.5 pg (ref 26.0–34.0)
MCHC: 33.9 g/dL (ref 30.0–36.0)
MCV: 90.2 fL (ref 80.0–100.0)
Platelets: 206 10*3/uL (ref 150–400)
RBC: 3.57 MIL/uL — ABNORMAL LOW (ref 3.87–5.11)
RDW: 15.3 % (ref 11.5–15.5)
WBC: 8.2 10*3/uL (ref 4.0–10.5)
nRBC: 0 % (ref 0.0–0.2)

## 2021-12-14 LAB — GLUCOSE, CAPILLARY
Glucose-Capillary: 75 mg/dL (ref 70–99)
Glucose-Capillary: 78 mg/dL (ref 70–99)

## 2021-12-14 LAB — TYPE AND SCREEN
ABO/RH(D): O POS
Antibody Screen: NEGATIVE

## 2021-12-14 MED ORDER — ONDANSETRON HCL 4 MG/2ML IJ SOLN: 4.0000 mg | Freq: Four times a day (QID) | INTRAMUSCULAR | Status: DC | PRN

## 2021-12-14 MED ORDER — ZOLPIDEM TARTRATE 5 MG PO TABS: 5.0000 mg | ORAL_TABLET | Freq: Every evening | ORAL | Status: DC | PRN

## 2021-12-14 MED ORDER — MISOPROSTOL 50MCG HALF TABLET: 50.0000 ug | ORAL_TABLET | Freq: Once | ORAL | Status: DC

## 2021-12-14 MED ORDER — HYDROXYZINE HCL 50 MG PO TABS: 50.0000 mg | ORAL_TABLET | Freq: Four times a day (QID) | ORAL | Status: DC | PRN

## 2021-12-14 MED ORDER — OXYTOCIN-SODIUM CHLORIDE 30-0.9 UT/500ML-% IV SOLN: 1.0000 m[IU]/min | INTRAVENOUS | Status: DC

## 2021-12-14 MED ORDER — FENTANYL-BUPIVACAINE-NACL 0.5-0.125-0.9 MG/250ML-% EP SOLN
12.0000 mL/h | EPIDURAL | Status: DC | PRN
  Filled 2021-12-14: qty 250

## 2021-12-14 MED ORDER — LIDOCAINE HCL (PF) 1 % IJ SOLN: 30.0000 mL | INTRAMUSCULAR | Status: DC | PRN

## 2021-12-14 MED ORDER — MISOPROSTOL 25 MCG QUARTER TABLET: 25.0000 ug | ORAL_TABLET | Freq: Once | ORAL | Status: DC

## 2021-12-14 MED ORDER — MISOPROSTOL 25 MCG QUARTER TABLET
25.0000 ug | ORAL_TABLET | Freq: Once | ORAL | Status: AC
  Administered 2021-12-14: 25 ug via VAGINAL
  Filled 2021-12-14: qty 1

## 2021-12-14 MED ORDER — OXYTOCIN-SODIUM CHLORIDE 30-0.9 UT/500ML-% IV SOLN: 2.5000 [IU]/h | INTRAVENOUS | Status: DC

## 2021-12-14 MED ORDER — DIPHENHYDRAMINE HCL 50 MG/ML IJ SOLN: 12.5000 mg | INTRAMUSCULAR | Status: DC | PRN

## 2021-12-14 MED ORDER — OXYTOCIN BOLUS FROM INFUSION
333.0000 mL | Freq: Once | INTRAVENOUS | Status: AC
  Administered 2021-12-15: 333 mL via INTRAVENOUS

## 2021-12-14 MED ORDER — FENTANYL CITRATE (PF) 100 MCG/2ML IJ SOLN
50.0000 ug | INTRAMUSCULAR | Status: DC | PRN
  Administered 2021-12-14 (×3): 100 ug via INTRAVENOUS
  Filled 2021-12-14 (×2): qty 2

## 2021-12-14 MED ORDER — OXYTOCIN-SODIUM CHLORIDE 30-0.9 UT/500ML-% IV SOLN
1.0000 m[IU]/min | INTRAVENOUS | Status: DC
  Administered 2021-12-14: 2 m[IU]/min via INTRAVENOUS
  Filled 2021-12-14: qty 500

## 2021-12-14 MED ORDER — PHENYLEPHRINE 80 MCG/ML (10ML) SYRINGE FOR IV PUSH (FOR BLOOD PRESSURE SUPPORT): 80.0000 ug | PREFILLED_SYRINGE | INTRAVENOUS | Status: DC | PRN

## 2021-12-14 MED ORDER — EPHEDRINE 5 MG/ML INJ: 10.0000 mg | INTRAVENOUS | Status: DC | PRN

## 2021-12-14 MED ORDER — MISOPROSTOL 50MCG HALF TABLET
50.0000 ug | ORAL_TABLET | Freq: Once | ORAL | Status: AC
  Administered 2021-12-14: 50 ug via ORAL
  Filled 2021-12-14: qty 1

## 2021-12-14 MED ORDER — SOD CITRATE-CITRIC ACID 500-334 MG/5ML PO SOLN: 30.0000 mL | ORAL | Status: DC | PRN

## 2021-12-14 MED ORDER — LACTATED RINGERS IV SOLN
500.0000 mL | Freq: Once | INTRAVENOUS | Status: AC
  Administered 2021-12-14: 500 mL via INTRAVENOUS

## 2021-12-14 MED ORDER — ACETAMINOPHEN 325 MG PO TABS: 650.0000 mg | ORAL_TABLET | ORAL | Status: DC | PRN

## 2021-12-14 MED ORDER — LACTATED RINGERS IV SOLN: 500.0000 mL | INTRAVENOUS | Status: DC | PRN

## 2021-12-14 MED ORDER — OXYCODONE-ACETAMINOPHEN 5-325 MG PO TABS: 2.0000 | ORAL_TABLET | ORAL | Status: DC | PRN

## 2021-12-14 MED ORDER — TERBUTALINE SULFATE 1 MG/ML IJ SOLN: 0.2500 mg | Freq: Once | INTRAMUSCULAR | Status: DC | PRN

## 2021-12-14 MED ORDER — FLEET ENEMA 7-19 GM/118ML RE ENEM: 1.0000 | ENEMA | Freq: Every day | RECTAL | Status: DC | PRN

## 2021-12-14 MED ORDER — OXYCODONE-ACETAMINOPHEN 5-325 MG PO TABS: 1.0000 | ORAL_TABLET | ORAL | Status: DC | PRN

## 2021-12-14 MED ORDER — LACTATED RINGERS IV SOLN: INTRAVENOUS | Status: DC

## 2021-12-14 NOTE — Progress Notes (Signed)
Labor Progress Note [name] is a 36 y.o. G3P2002 at [redacted]w[redacted]d presented for IOL cHTN SIPE w/o SF  S: Pt doing well, ready for AROM  O:  BP (!) 140/96   Pulse (!) 102   Temp 98.1 F (36.7 C) (Oral)   Resp 17   Ht 5\' 4"  (1.626 m)   LMP 03/23/2021 (Exact Date)   BMI 33.39 kg/m  EFM: 135bpm/Moderate variability/ 15x15 accels/ Variable decels  CVE: Dilation: 6 Effacement (%): 60, 70 Station: -1, 0 Presentation: Vertex Exam by:: Dr. [email or web address]   A&P: 36 y.o. 20 [redacted]w[redacted]d IOL cHTN SIPE #Labor: Progressing well. AROM performed at 2333. Tolerated well #Pain: IV pain meds, epidural upon request, family support #FWB: CAT 1 #GBS negative #cHTN SIPE w/o SF: P/C 1.7. BP not in severe ranges, avg 130-140s/80-90s #A2GDM: last CBG 78, back on metformin once able to eat normally  2334, DO 11:39 PM

## 2021-12-14 NOTE — Progress Notes (Signed)
Labor Progress Note Brandi Sanders is a 36 y.o. G3P2002 at [redacted]w[redacted]d presented for IOL cHTN SIPE w/o SF  S: Pt doing well, just received IV pain meds  O:  BP (!) 140/92   Pulse (!) 106   Temp 98.3 F (36.8 C) (Oral)   Resp 17   Ht 5\' 4"  (1.626 m)   LMP 03/23/2021 (Exact Date)   BMI 33.39 kg/m  EFM: 135bpm/Moderate variability/ 15x15 accels/ None decels  CVE: Dilation: 4 Effacement (%): 70 Station: -3, -2 Presentation: Vertex Exam by:: Cashion MD   A&P: 36 y.o. G3P2002 [redacted]w[redacted]d IOL cHTN SIPE #Labor: Progressing well. Plan to AROM at next SVE. Pt agrees #Pain: IV pain meds, epidural upon request, family support #FWB: CAT 1 #GBS negative #cHTN SIPE w/o SF: P/C 1.7. BP not in severe ranges, avg 130-140s/80-90s  [redacted]w[redacted]d, DO 9:58 PM
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
## 2021-12-14 NOTE — Progress Notes (Deleted)
Patient was unaware that she had an appointment today.  Discussed abnormal lab values concerning for superimposed pre-eclampsia and recommendations for earlier induction of labor. Patient is agreeable with plan.   Labor and delivery contacted with need for direct admit. They informed me that they will contact the patient to come in to start induction of labor

## 2021-12-14 NOTE — Progress Notes (Signed)
Labor Progress Note Brandi Sanders is a 36 y.o. female G3P2002 with IUP at [redacted]w[redacted]d by LMP presenting for IOL due to [facility] with superimposed pre-eclampsia.  S: Doing well, starting to feel contractions.   O:  BP (!) 140/92   Pulse (!) 106   Temp 97.6 F (36.4 C) (Oral)   Resp 18   Ht 5\' 4"  (1.626 m)   LMP 03/23/2021 (Exact Date)   BMI 33.39 kg/m  EFM: 140/moderate variability/(+) acels, no decelerations  CVE: Dilation: 4 Effacement (%): 70 Station: -3, -2 Presentation: Vertex Exam by:: Shawnell Dykes MD   A&P: 36 y.o. 20 [redacted]w[redacted]d admitted for IOL due to cHTN with superimposed PEC.  #Labor: Progressing well. FB out at 1800. Will start pitocin 2x2.  #Pain: Pain 7/10. Planning for eventual epidural.  #FWB: Cat I #GBS negative  #cHTN w/ superimposed PEC: Few BP's over 140/90.  PCR 1.7. No other s/s of severe features. Will continue to monitor   #A2GDM: On metformin. Patient is currently euglycemic. Will continue to monitor CBG every 4 hours.   Brandi Sanders 9/10, MD Resident Physician 7:38 PM

## 2021-12-15 ENCOUNTER — Encounter (HOSPITAL_COMMUNITY): Payer: Self-pay | Admitting: Obstetrics & Gynecology

## 2021-12-15 ENCOUNTER — Inpatient Hospital Stay (HOSPITAL_COMMUNITY): Payer: Medicaid Other | Admitting: Anesthesiology

## 2021-12-15 DIAGNOSIS — Z3A38 38 weeks gestation of pregnancy: Secondary | ICD-10-CM | POA: Diagnosis not present

## 2021-12-15 DIAGNOSIS — O24425 Gestational diabetes mellitus in childbirth, controlled by oral hypoglycemic drugs: Secondary | ICD-10-CM | POA: Diagnosis not present

## 2021-12-15 DIAGNOSIS — O114 Pre-existing hypertension with pre-eclampsia, complicating childbirth: Secondary | ICD-10-CM | POA: Diagnosis not present

## 2021-12-15 DIAGNOSIS — O24429 Gestational diabetes mellitus in childbirth, unspecified control: Secondary | ICD-10-CM | POA: Diagnosis not present

## 2021-12-15 DIAGNOSIS — O1092 Unspecified pre-existing hypertension complicating childbirth: Secondary | ICD-10-CM | POA: Diagnosis not present

## 2021-12-15 LAB — CBC
HCT: 31.8 % — ABNORMAL LOW (ref 36.0–46.0)
Hemoglobin: 10.9 g/dL — ABNORMAL LOW (ref 12.0–15.0)
MCH: 30.8 pg (ref 26.0–34.0)
MCHC: 34.3 g/dL (ref 30.0–36.0)
MCV: 89.8 fL (ref 80.0–100.0)
Platelets: 173 10*3/uL (ref 150–400)
RBC: 3.54 MIL/uL — ABNORMAL LOW (ref 3.87–5.11)
RDW: 15.1 % (ref 11.5–15.5)
WBC: 11.4 10*3/uL — ABNORMAL HIGH (ref 4.0–10.5)
nRBC: 0 % (ref 0.0–0.2)

## 2021-12-15 LAB — GLUCOSE, CAPILLARY
Glucose-Capillary: 81 mg/dL (ref 70–99)
Glucose-Capillary: 96 mg/dL (ref 70–99)

## 2021-12-15 LAB — RPR: RPR Ser Ql: NONREACTIVE

## 2021-12-15 MED ORDER — FUROSEMIDE 20 MG PO TABS
20.0000 mg | ORAL_TABLET | Freq: Every day | ORAL | Status: DC
  Administered 2021-12-15 – 2021-12-17 (×3): 20 mg via ORAL
  Filled 2021-12-15 (×3): qty 1

## 2021-12-15 MED ORDER — MEDROXYPROGESTERONE ACETATE 150 MG/ML IM SUSP
150.0000 mg | INTRAMUSCULAR | Status: AC | PRN
  Administered 2021-12-17: 150 mg via INTRAMUSCULAR
  Filled 2021-12-15: qty 1

## 2021-12-15 MED ORDER — SODIUM CHLORIDE 0.9% FLUSH: 3.0000 mL | INTRAVENOUS | Status: DC | PRN

## 2021-12-15 MED ORDER — CEFAZOLIN SODIUM-DEXTROSE 2-4 GM/100ML-% IV SOLN
2.0000 g | Freq: Once | INTRAVENOUS | Status: AC
  Administered 2021-12-15: 2 g via INTRAVENOUS
  Filled 2021-12-15 (×2): qty 100

## 2021-12-15 MED ORDER — ACETAMINOPHEN 325 MG PO TABS: 650.0000 mg | ORAL_TABLET | ORAL | Status: DC | PRN

## 2021-12-15 MED ORDER — SODIUM CHLORIDE 0.9 % IV SOLN: 250.0000 mL | INTRAVENOUS | Status: DC | PRN

## 2021-12-15 MED ORDER — BENZOCAINE-MENTHOL 20-0.5 % EX AERO
1.0000 | INHALATION_SPRAY | CUTANEOUS | Status: DC | PRN
  Administered 2021-12-15: 1 via TOPICAL
  Filled 2021-12-15: qty 56

## 2021-12-15 MED ORDER — FENTANYL-BUPIVACAINE-NACL 0.5-0.125-0.9 MG/250ML-% EP SOLN
EPIDURAL | Status: DC | PRN
  Administered 2021-12-15: 12 mL/h via EPIDURAL

## 2021-12-15 MED ORDER — METFORMIN HCL 500 MG PO TABS
1000.0000 mg | ORAL_TABLET | Freq: Two times a day (BID) | ORAL | Status: DC
  Administered 2021-12-15: 1000 mg via ORAL
  Filled 2021-12-15 (×2): qty 2

## 2021-12-15 MED ORDER — SENNOSIDES-DOCUSATE SODIUM 8.6-50 MG PO TABS
2.0000 | ORAL_TABLET | ORAL | Status: DC
  Administered 2021-12-15 – 2021-12-17 (×3): 2 via ORAL
  Filled 2021-12-15 (×4): qty 2

## 2021-12-15 MED ORDER — SODIUM CHLORIDE 0.9% FLUSH: 3.0000 mL | Freq: Two times a day (BID) | INTRAVENOUS | Status: DC

## 2021-12-15 MED ORDER — PRENATAL MULTIVITAMIN CH
1.0000 | ORAL_TABLET | Freq: Every day | ORAL | Status: DC
  Administered 2021-12-15 – 2021-12-17 (×3): 1 via ORAL
  Filled 2021-12-15 (×3): qty 1

## 2021-12-15 MED ORDER — ZOLPIDEM TARTRATE 5 MG PO TABS: 5.0000 mg | ORAL_TABLET | Freq: Every evening | ORAL | Status: DC | PRN

## 2021-12-15 MED ORDER — WITCH HAZEL-GLYCERIN EX PADS: 1.0000 | MEDICATED_PAD | CUTANEOUS | Status: DC | PRN

## 2021-12-15 MED ORDER — DIBUCAINE (PERIANAL) 1 % EX OINT: 1.0000 | TOPICAL_OINTMENT | CUTANEOUS | Status: DC | PRN

## 2021-12-15 MED ORDER — SIMETHICONE 80 MG PO CHEW: 80.0000 mg | CHEWABLE_TABLET | ORAL | Status: DC | PRN

## 2021-12-15 MED ORDER — ONDANSETRON HCL 4 MG PO TABS: 4.0000 mg | ORAL_TABLET | ORAL | Status: DC | PRN

## 2021-12-15 MED ORDER — IBUPROFEN 600 MG PO TABS
600.0000 mg | ORAL_TABLET | Freq: Four times a day (QID) | ORAL | Status: DC
  Administered 2021-12-15 – 2021-12-17 (×9): 600 mg via ORAL
  Filled 2021-12-15 (×8): qty 1

## 2021-12-15 MED ORDER — DIPHENHYDRAMINE HCL 25 MG PO CAPS: 25.0000 mg | ORAL_CAPSULE | Freq: Four times a day (QID) | ORAL | Status: DC | PRN

## 2021-12-15 MED ORDER — COCONUT OIL OIL: 1.0000 | TOPICAL_OIL | Status: DC | PRN

## 2021-12-15 MED ORDER — LIDOCAINE HCL (PF) 1 % IJ SOLN
INTRAMUSCULAR | Status: DC | PRN
  Administered 2021-12-15: 5 mL via EPIDURAL

## 2021-12-15 MED ORDER — ONDANSETRON HCL 4 MG/2ML IJ SOLN: 4.0000 mg | INTRAMUSCULAR | Status: DC | PRN

## 2021-12-15 NOTE — Lactation Note (Addendum)
This note was copied from a baby's chart. Lactation Consultation Note  Patient Name: Brandi Sanders VCBSW'H Date: 12/15/2021 Reason for consult: L&D Initial assessment;Early term 37-38.6wks;Maternal endocrine disorder Age:35 hours Baby crying when LC came into rm. Placed on the breast and baby calmed down and suckled at intervals. Needing some stimulation at times to suckle at breast. It had been a while since mom last BF her other children so LC quickly reviewed positioning and support. Encouraged mom to call for assistance as needed. Mom has GDM. Reviewed s/sx of hypoglycemia of baby. Encouraged STS.  Maternal Data Does the patient have breastfeeding experience prior to this delivery?: Yes How long did the patient breastfeed?: 4 months each  Feeding    LATCH Score Latch: Repeated attempts needed to sustain latch, nipple held in mouth throughout feeding, stimulation needed to elicit sucking reflex.  Audible Swallowing: None  Type of Nipple: Everted at rest and after stimulation  Comfort (Breast/Nipple): Soft / non-tender  Hold (Positioning): Assistance needed to correctly position infant at breast and maintain latch.  LATCH Score: 6   Lactation Tools Discussed/Used    Interventions Interventions: Adjust position;Assisted with latch;Support pillows;Skin to skin;Breast compression  Discharge    Consult Status Consult Status: Follow-up from L&D Date: 12/15/21 Follow-up type: In-patient    Charyl Dancer 12/15/2021, 6:32 AM

## 2021-12-15 NOTE — Progress Notes (Signed)
Called MD regarding BP out of range for our protocol. MD instructed to call only if BP >140/100. Will continue to monitor.

## 2021-12-15 NOTE — Plan of Care (Signed)

## 2021-12-15 NOTE — Anesthesia Preprocedure Evaluation (Addendum)
Anesthesia Evaluation  Patient identified by MRN, date of birth, ID band Patient awake    Reviewed: Allergy & Precautions, NPO status , Patient's Chart, lab work & pertinent test results  Airway Mallampati: II  TM Distance: >3 FB Neck ROM: Full    Dental no notable dental hx. (+) Teeth Intact, Dental Advisory Given   Pulmonary former smoker,    Pulmonary exam normal breath sounds clear to auscultation       Cardiovascular hypertension (cHTn w superimposed pre E), Normal cardiovascular exam Rhythm:Regular Rate:Normal     Neuro/Psych    GI/Hepatic Neg liver ROS,   Endo/Other  diabetes, Gestational  Renal/GU      Musculoskeletal   Abdominal   Peds  Hematology Lab Results      Component                Value               Date                      WBC                      9.1                 12/14/2021                HGB                      11.0 (L)            12/14/2021                HCT                      32.9 (L)            12/14/2021                 PLT                      190                 12/14/2021              Anesthesia Other Findings   Reproductive/Obstetrics (+) Pregnancy                             Anesthesia Physical Anesthesia Plan  ASA: 3  Anesthesia Plan: Epidural   Post-op Pain Management:    Induction:   PONV Risk Score and Plan:   Airway Management Planned:   Additional Equipment:   Intra-op Plan:   Post-operative Plan:   Informed Consent: I have reviewed the patients History and Physical, chart, labs and discussed the procedure including the risks, benefits and alternatives for the proposed anesthesia with the patient or authorized representative who has indicated his/her understanding and acceptance.       Plan Discussed with:   Anesthesia Plan Comments: (38.1 Wk G3P2 ew CHtn , prE and gDm for LEA)        Anesthesia Quick Evaluation

## 2021-12-15 NOTE — Anesthesia Procedure Notes (Signed)
Epidural Patient location during procedure: OB Start time: 12/15/2021 12:08 AM End time: 12/15/2021 12:19 AM  Staffing Anesthesiologist: Trevor Iha, MD Performed: anesthesiologist   Preanesthetic Checklist Completed: patient identified, IV checked, site marked, risks and benefits discussed, surgical consent, monitors and equipment checked, pre-op evaluation and timeout performed  Epidural Patient position: sitting Prep: DuraPrep and site prepped and draped Patient monitoring: continuous pulse ox and blood pressure Approach: midline Location: L3-L4 Injection technique: LOR air  Needle:  Needle type: Tuohy  Needle gauge: 17 G Needle length: 9 cm and 9 Needle insertion depth: 5 cm cm Catheter type: closed end flexible Catheter size: 19 Gauge Catheter at skin depth: 12 cm Test dose: negative  Assessment Events: blood not aspirated, injection not painful, no injection resistance, no paresthesia and negative IV test  Additional Notes Patient identified. Risks/Benefits/Options discussed with patient including but not limited to bleeding, infection, nerve damage, paralysis, failed block, incomplete pain control, headache, blood pressure changes, nausea, vomiting, reactions to medication both or allergic, itching and postpartum back pain. Confirmed with bedside nurse the patient's most recent platelet count. Confirmed with patient that they are not currently taking any anticoagulation, have any bleeding history or any family history of bleeding disorders. Patient expressed understanding and wished to proceed. All questions were answered. Sterile technique was used throughout the entire procedure. Please see nursing notes for vital signs. Test dose was given through epidural needle and negative prior to continuing to dose epidural or start infusion. Warning signs of high block given to the patient including shortness of breath, tingling/numbness in hands, complete motor block, or any  concerning symptoms with instructions to call for help. Patient was given instructions on fall risk and not to get out of bed. All questions and concerns addressed with instructions to call with any issues.  1 Attempt (S) . Patient tolerated procedure well.

## 2021-12-15 NOTE — Discharge Summary (Addendum)
Postpartum Discharge Summary  Date of Service 12/17/21     Patient Name: Brandi Sanders DOB: 1985/09/13 MRN: [id number]  Date of admission: 12/14/2021 Delivery date:12/15/2021  Delivering provider: Shelda Pal  Date of discharge: 12/17/2021  Admitting diagnosis: Chronic hypertension during pregnancy, antepartum [O10.919] Intrauterine pregnancy: [redacted]w[redacted]d    Secondary diagnosis:  Principal Problem:   Vaginal delivery Active Problems:   Moderate episode of recurrent major depressive disorder ([place]   Supervision of high risk pregnancy in first trimester   Chronic hypertension during pregnancy, antepartum   Maternal obesity affecting pregnancy, antepartum   Gestational diabetes mellitus (GDM) controlled on oral hypoglycemic drug, antepartum  Additional problems: none    Discharge diagnosis: Term Pregnancy Delivered, CHTN with superimposed preeclampsia, and GDM A2                                              Post partum procedures: none Augmentation: AROM, Pitocin, and IP Foley Complications: None  Hospital course: Induction of Labor With Vaginal Delivery   36y.o. yo G3P2002 at 315w1das admitted to the hospital 12/14/2021 for induction of labor.  Indication for induction: A2 DM and cHTN SIPE w/o SF .  Patient had an uncomplicated labor course as follows: Membrane Rupture Time/Date: 11:33 PM ,12/14/2021   Delivery Method:Vaginal, Spontaneous  Episiotomy: None  Lacerations:  None  Details of delivery can be found in separate delivery note.  Patient noted to have persistently elevated blood pressures postpartum and was started on nifedipine and a 5 day course of lasix. Patient is discharged home 12/17/21. She will be seen in the clinic in 1 week for a blood pressure check.  Newborn Data: Birth date:12/15/2021  Birth time:5:22 AM  Gender:Female  Living status:Living  Apgars:8 ,9  Weight:3220 g   Magnesium Sulfate received: No BMZ received:  No Rhophylac:N/A MMR:N/A T-DaP:Given prenatally Flu: N/A Transfusion:No  Physical exam  Vitals:   12/16/21 1630 12/16/21 1755 12/16/21 2027 12/17/21 0559  BP: (!) 138/98 (!) 147/92 (!) 133/93 (!) 139/98  Pulse: 100 93 98 96  Resp: 20  18 17   Temp: 99.4 F (37.4 C)  98.2 F (36.8 C) 97.7 F (36.5 C)  TempSrc: Oral  Oral Oral  SpO2: 100%     Height:       General: alert, cooperative, and no distress Lochia: appropriate Uterine Fundus: firm Incision: Wound vac in place DVT Evaluation: No evidence of DVT seen on physical exam. Labs: Lab Results  Component Value Date   WBC 9.4 12/16/2021   HGB 10.8 (L) 12/16/2021   HCT 31.7 (L) 12/16/2021   MCV 89.8 12/16/2021   PLT 198 12/16/2021      Latest Ref Rng & Units 12/14/2021    2:24 PM  CMP  Glucose 70 - 99 mg/dL 79   BUN 6 - 20 mg/dL 6   Creatinine 0.44 - 1.00 mg/dL 0.59   Sodium 135 - 145 mmol/L 136   Potassium 3.5 - 5.1 mmol/L 3.9   Chloride 98 - 111 mmol/L 109   CO2 22 - 32 mmol/L 20   Calcium 8.9 - 10.3 mg/dL 9.1   Total Protein 6.5 - 8.1 g/dL 6.4   Total Bilirubin 0.3 - 1.2 mg/dL 0.3   Alkaline Phos 38 - 126 U/L 132   AST 15 - 41 U/L 32   ALT 0 -  44 U/L 12    Edinburgh Score:    12/15/2021   11:07 AM  Edinburgh Postnatal Depression Scale Screening Tool  I have been able to laugh and see the funny side of things. 0  I have looked forward with enjoyment to things. 0  I have blamed myself unnecessarily when things went wrong. 0  I have been anxious or worried for no good reason. 0  I have felt scared or panicky for no good reason. 0  Things have been getting on top of me. 0  I have been so unhappy that I have had difficulty sleeping. 0  I have felt sad or miserable. 0  I have been so unhappy that I have been crying. 0  The thought of harming myself has occurred to me. 0  Edinburgh Postnatal Depression Scale Total 0     After visit meds:  Allergies as of 12/17/2021       Reactions   Bee Pollen Swelling    Takes flonase to help prevent epistaxis. Nose bleeds, sneezing, runny nose.         Medication List     STOP taking these medications    aspirin EC 81 MG tablet   metFORMIN 500 MG tablet Commonly known as: Glucophage   multivitamin-prenatal 27-0.8 MG Tabs tablet       TAKE these medications    Accu-Chek Guide test strip Generic drug: glucose blood Use to check blood sugars four times a day was instructed   Accu-Chek Guide w/Device Kit 1 Device by Does not apply route 4 (four) times daily.   Accu-Chek Softclix Lancets lancets 1 each by Other route 4 (four) times daily.   acetaminophen 325 MG tablet Commonly known as: Tylenol Take 2 tablets (650 mg total) by mouth every 6 (six) hours as needed (for pain scale < 4).   furosemide 20 MG tablet Commonly known as: LASIX Take 1 tablet (20 mg total) by mouth daily.   ibuprofen 600 MG tablet Commonly known as: ADVIL Take 1 tablet (600 mg total) by mouth every 6 (six) hours.   NIFEdipine 30 MG 24 hr tablet Commonly known as: ADALAT CC Take 1 tablet (30 mg total) by mouth 2 (two) times daily.   pantoprazole 40 MG tablet Commonly known as: Protonix Take 1 tablet (40 mg total) by mouth daily.         Discharge home in stable condition Infant Feeding: Breast Infant Disposition:home with mother Discharge instruction: per After Visit Summary and Postpartum booklet. Activity: Advance as tolerated. Pelvic rest for 6 weeks.  Diet: routine diet Future Appointments: No future appointments.  Follow up Visit:  Message sent to [facility] on 12/17/21--Jessica Mercado-Ortiz, DO  Please schedule this patient for a In person postpartum visit in 6 weeks with the following provider: Any provider. Additional Postpartum F/U:2 hour GTT and BP check 1 week  High risk pregnancy complicated by: GDM and HTN Delivery mode:  Vaginal, Spontaneous  Anticipated Birth Control:  Depo   12/17/2021 Salvadore Oxford, MD   Fellow  Attestation  I saw and evaluated the patient, performing the key elements of the service.I  personally performed or re-performed the history, physical exam, and medical decision making activities of this service and have verified that the service and findings are accurately documented in the resident's note. I developed the management plan that is described in the resident's note, and I agree with the content, with my edits above.   Shantil Vallejo Sherrilyn Rist, MD,MP[facility] OB Fellow, Faculty  Practice  12/17/2021 1:27 PM    Ivie Maese MD MPH OB Fellow, [place] for [place] 12/17/2021

## 2021-12-15 NOTE — Lactation Note (Addendum)
This note was copied from a baby's chart. Lactation Consultation Note  Patient Name: Brandi Sanders OVFIE'P Date: 12/15/2021 Reason for consult: Initial assessment;Early term 37-38.6wks Age:35 hours   P3: Early term infant at 38+1 weeks Feeding preference: Breast GDM: Initial blood sugar was 63 mg/dl  Birth parent doing STS with baby; infant asleep.  Offered to review breast feeding basics, hand expression and latching with family.  Birth parent stated she feels comfortable and baby has been latching well.  She does not desire any assistance. Since it has been 13 years since she last breast fed, I offered to make her a "prn" status instead of a "complete" in case she needs assistance.  Birth parent agreeable.  Support person and one other visitor present.  RN updated.   Maternal Data Has patient been taught Hand Expression?: No (Birth parent stated she needs no review) Does the patient have breastfeeding experience prior to this delivery?: Yes How long did the patient breastfeed?: 4 months with her other two children (7 and 67 years old)  Feeding Mother's Current Feeding Choice: Breast Milk  LATCH Score Latch: Repeated attempts needed to sustain latch, nipple held in mouth throughout feeding, stimulation needed to elicit sucking reflex.  Audible Swallowing: None  Type of Nipple: Everted at rest and after stimulation  Comfort (Breast/Nipple): Soft / non-tender  Hold (Positioning): Assistance needed to correctly position infant at breast and maintain latch.  LATCH Score: 6   Lactation Tools Discussed/Used    Interventions Interventions: Adjust position;Assisted with latch;Support pillows;Skin to skin;Breast compression  Discharge Pump: Personal  Consult Status Consult Status: PRN Date: 12/15/21 Follow-up type: In-patient    Anessa Charley R Kendelle Schweers 12/15/2021, 10:13 AM

## 2021-12-15 NOTE — Progress Notes (Signed)
Labor Progress Note Brandi Sanders is a 36 y.o. G3P2002 at [redacted]w[redacted]d presented for IOL cHTN SIPE w/o SF  S: Pt doing well.  O:  BP 131/82   Pulse (!) 106   Temp (!) 97.5 F (36.4 C) (Oral)   Resp 16   Ht 5\' 4"  (1.626 m)   LMP 03/23/2021 (Exact Date)   SpO2 98%   BMI 33.39 kg/m  EFM: 130 bpm/Moderate variability/ 15x15 accels/ Early decels  CVE: Dilation: 6 Effacement (%): 90 Station: 0 Presentation: Vertex Exam by:: Dr. [email or web address]   A&P: 36 y.o. 20 [redacted]w[redacted]d IOL cHTN SIPE #Labor: Progressing well. AROM performed at 2333. Tolerated well #Pain: Epidural in place #FWB: CAT 1 #GBS negative #cHTN SIPE w/o SF: P/C 1.7. CMP/plt nml. BP not in severe ranges, avg 130-140s/80-90s #A2GDM: last CBG 78, back on metformin once able to eat normally  2334 Mercado-Ortiz, DO 2:12 AM

## 2021-12-16 ENCOUNTER — Ambulatory Visit: Payer: Medicaid Other

## 2021-12-16 ENCOUNTER — Inpatient Hospital Stay (HOSPITAL_COMMUNITY): Payer: Medicaid Other

## 2021-12-16 LAB — CBC
HCT: 31.7 % — ABNORMAL LOW (ref 36.0–46.0)
Hemoglobin: 10.8 g/dL — ABNORMAL LOW (ref 12.0–15.0)
MCH: 30.6 pg (ref 26.0–34.0)
MCHC: 34.1 g/dL (ref 30.0–36.0)
MCV: 89.8 fL (ref 80.0–100.0)
Platelets: 198 10*3/uL (ref 150–400)
RBC: 3.53 MIL/uL — ABNORMAL LOW (ref 3.87–5.11)
RDW: 15.2 % (ref 11.5–15.5)
WBC: 9.4 10*3/uL (ref 4.0–10.5)
nRBC: 0 % (ref 0.0–0.2)

## 2021-12-16 LAB — GLUCOSE, CAPILLARY: Glucose-Capillary: 95 mg/dL (ref 70–99)

## 2021-12-16 MED ORDER — NIFEDIPINE ER OSMOTIC RELEASE 30 MG PO TB24
30.0000 mg | ORAL_TABLET | Freq: Every day | ORAL | Status: DC
  Administered 2021-12-16: 30 mg via ORAL
  Filled 2021-12-16: qty 1

## 2021-12-16 NOTE — Social Work (Signed)
CSW received consult for Major Depression Disorder and history of drug and alcohol use.  CSW met with MOB to offer support and complete assessment. CSW entered the room, introduced self, CSW role and reason for visit. MOB was agreeable to visit. CSW observed MOB in bed holding the infant and MOB's mob on the couch, MOB granted CSW permission to speak in front of her. MOB presented with flat affect during assessment but was open to questions.  CSW inquired about how MOB was feeling. MOB reported she was good. CSW inquired about MOB MH hx. MOB reported her diagnoses was "awhile ago". Over 5 years ago and she has not no had any recent symptoms of depression. MOB reported she was taking medication and in therapy but has not had any treatment in over 3 years. CSW inquired about her mood during this pregnancy, MOB reported a stable mood. CSW assessed for safety, MOB denied any SI, HI or DV. MOB identified her mom and spouse as her supports. CSW provided education regarding the baby blues period vs. perinatal mood disorders, discussed treatment and gave resources for mental health follow up if concerns arise. CSW recommends self-evaluation during the postpartum time period using the New Mom Checklist from Postpartum Progress and encouraged MOB to contact a medical professional if symptoms are noted at any time.    CSW provided review of Sudden Infant Death Syndrome (SIDS) precautions.  MOB identified [facility] for infants follow up care. MOB reported they have all necessary items for the infant including a bassinet for her to sleep.   CSW received consult for hx of marijuana use.  Referral was screened out due to the following: ~MOB had no documented substance use after initial prenatal visit/+UPT. ~MOB had no positive drug screens after initial prenatal visit/+UPT. ~Baby's UDS is negative.  CSW identifies no further need for intervention and no barriers to discharge at this time.   Brandi Sanders, LCSWA Clinical Social Worker [phone] 

## 2021-12-16 NOTE — Progress Notes (Signed)
POSTPARTUM PROGRESS NOTE  Post Partum Day #1  Subjective:  Brandi Sanders is a 36 y.o. G3P3003 s/p SVD at [redacted]w[redacted]d.  She reports she is doing well. No acute events overnight. She denies any problems with ambulating, voiding or po intake. Denies nausea or vomiting.  Pain is well controlled.  Lochia is appropriate.  Objective: Blood pressure 125/85, pulse 94, temperature 98.4 F (36.9 C), temperature source Oral, resp. rate 16, height 5\' 4"  (1.626 m), last menstrual period 03/23/2021, SpO2 98 %, unknown if currently breastfeeding.  Physical Exam:  General: alert, cooperative and no distress Chest: no respiratory distress Heart:regular rate, distal pulses intact Abdomen: soft, nontender,  Uterine Fundus: firm, appropriately tender DVT Evaluation: No calf swelling or tenderness Extremities: No edema Skin: warm, dry  Recent Labs    12/15/21 0606 12/16/21 0516  HGB 10.9* 10.8*  HCT 31.8* 31.7*    Assessment/Plan: Brandi Sanders is a 36 y.o. 20 s/p SVD at [redacted]w[redacted]d   PPD#1 - Doing well  Routine postpartum care #cHTN with superimposed PEC: BP's have been elevated. She was started on lasix yesterday. BP's continue to be elevated today so started on Procardia 30mg  XL qd.  #A2GDM: Has been well controlled thus far. Fasting CBG scheduled for tomorrow AM.  Contraception: Depo Feeding: Breast Dispo: Plan for discharge 12/17/21.   LOS: 2 days   , MD Resident Physician 12/16/2021, 7:46 AM

## 2021-12-16 NOTE — Anesthesia Postprocedure Evaluation (Signed)
Anesthesia Post Note  Patient: Brandi Sanders  Procedure(s) Performed: AN AD HOC LABOR EPIDURAL     Patient location during evaluation: Mother Baby Anesthesia Type: Epidural Level of consciousness: awake, oriented and awake and alert Pain management: pain level controlled Vital Signs Assessment: post-procedure vital signs reviewed and stable Respiratory status: spontaneous breathing, respiratory function stable and nonlabored ventilation Postop Assessment: no headache, adequate PO intake, able to ambulate, patient able to bend at knees and no apparent nausea or vomiting Anesthetic complications: no   No notable events documented.  Last Vitals:  Vitals:   12/16/21 0532 12/16/21 0629  BP: (!) 143/92 125/85  Pulse: 94   Resp: 16   Temp: 36.9 C   SpO2:      Last Pain:  Vitals:   12/16/21 0810  TempSrc:   PainSc: 0-No pain   Pain Goal:                Epidural/Spinal Function Cutaneous sensation: Normal sensation (12/16/21 0810), Patient able to flex knees: Yes (12/16/21 0810), Patient able to lift hips off bed: Yes (12/16/21 0810), Back pain beyond tenderness at insertion site: No (12/16/21 0810), Progressively worsening motor and/or sensory loss: No (12/16/21 0810), Bowel and/or bladder incontinence post epidural: No (12/16/21 0810)  Daton Szilagyi

## 2021-12-17 ENCOUNTER — Other Ambulatory Visit (HOSPITAL_COMMUNITY): Payer: Self-pay

## 2021-12-17 LAB — GLUCOSE, CAPILLARY: Glucose-Capillary: 101 mg/dL — ABNORMAL HIGH (ref 70–99)

## 2021-12-17 MED ORDER — IBUPROFEN 600 MG PO TABS
600.0000 mg | ORAL_TABLET | Freq: Four times a day (QID) | ORAL | 0 refills | Status: DC
  Filled 2021-12-17: qty 30, 8d supply, fill #0

## 2021-12-17 MED ORDER — NIFEDIPINE ER 30 MG PO TB24
30.0000 mg | ORAL_TABLET | Freq: Two times a day (BID) | ORAL | 0 refills | Status: DC
  Filled 2021-12-17: qty 60, 30d supply, fill #0

## 2021-12-17 MED ORDER — FUROSEMIDE 20 MG PO TABS
20.0000 mg | ORAL_TABLET | Freq: Every day | ORAL | 0 refills | Status: DC
  Filled 2021-12-17: qty 2, 2d supply, fill #0

## 2021-12-17 MED ORDER — NIFEDIPINE ER OSMOTIC RELEASE 30 MG PO TB24
30.0000 mg | ORAL_TABLET | Freq: Two times a day (BID) | ORAL | Status: DC
  Administered 2021-12-17: 30 mg via ORAL
  Filled 2021-12-17: qty 1

## 2021-12-17 MED ORDER — ACETAMINOPHEN 325 MG PO TABS: 650.0000 mg | ORAL_TABLET | Freq: Four times a day (QID) | ORAL | Status: DC | PRN

## 2021-12-17 NOTE — Discharge Instructions (Signed)
Please continue to check your sugars at home, if they are high resume taking your metformin, and follow-up with you primary care provider.

## 2021-12-17 NOTE — Lactation Note (Signed)
This note was copied from a baby's chart. Lactation Consultation Note  Patient Name: Girl Danyell Shader CBULA'G Date: 12/17/2021 Reason for consult: Follow-up assessment;Early term 35-36.6wks;Maternal endocrine disorder Age:36 years  Visited with family of 49 hours old ETI female, Ms. Landgrebe is a P3 and experienced breastfeeding, she's been breastfeeding independently while at the hospital and also supplementing with Similac 20 calorie formula until her milk comes in. Her plan is to eventually have baby on an exclusive breastmilk diet once her milk is in. Family is taking baby home today. Reviewed discharge education, lactogenesis II, infant feeding and pumping schedule, she also plans some pump and bottle feeding with her breastmilk. Encouraged 8-12 feedings/24 hours and to pump whenever baby is getting formula. FOB present and supportive, all questions and concerns answered, family to contact [facility] services PRN.  Feeding Mother's Current Feeding Choice: Breast Milk and Formula Nipple Type: Slow - flow  Interventions Interventions: Breast feeding basics reviewed;Education  Discharge Discharge Education: Engorgement and breast care;Warning signs for feeding baby;Outpatient recommendation Pump: Personal (DEBP at home)  Consult Status Consult Status: Complete Date: 12/17/21 Follow-up type: Call as needed   Sherwood Castilla Venetia Constable 12/17/2021, 11:08 AM

## 2021-12-19 ENCOUNTER — Other Ambulatory Visit: Payer: Self-pay | Admitting: Family Medicine

## 2021-12-19 ENCOUNTER — Other Ambulatory Visit: Payer: Self-pay

## 2021-12-20 ENCOUNTER — Other Ambulatory Visit: Payer: Self-pay

## 2021-12-21 ENCOUNTER — Encounter: Payer: Medicaid Other | Admitting: Obstetrics & Gynecology

## 2021-12-24 ENCOUNTER — Ambulatory Visit (INDEPENDENT_AMBULATORY_CARE_PROVIDER_SITE_OTHER): Payer: Medicaid Other

## 2021-12-24 VITALS — BP 137/94 | HR 88 | Ht 64.0 in | Wt 181.0 lb

## 2021-12-24 DIAGNOSIS — I10 Essential (primary) hypertension: Secondary | ICD-10-CM

## 2021-12-24 MED ORDER — NIFEDIPINE ER 30 MG PO TB24: 30.0000 mg | ORAL_TABLET | Freq: Two times a day (BID) | ORAL | 0 refills | Status: DC

## 2021-12-24 NOTE — Progress Notes (Addendum)
Subjective:  Brandi Sanders is a 36 y.o. female here for BP check.   Hypertension ROS: taking medications as instructed, no medication side effects noted, no TIA's, no chest pain on exertion, no dyspnea on exertion, and no swelling of ankles.    Objective:  BP (!) 137/94 (BP Location: Left Arm, Cuff Size: Normal)   Pulse 88   Ht 5\' 4"  (1.626 m)   Wt 181 lb (82.1 kg)   LMP 03/23/2021 (Exact Date)   Breastfeeding Yes   BMI 31.07 kg/m   Appearance alert, well appearing, and in no distress. General exam BP noted to be well controlled today in office.    Assessment:   Blood Pressure elevated in Office.   Plan:  Per MD; RTO in 1 week for BP check, start taking medication 2 times daily. Rx sent to Pharmacy

## 2021-12-28 ENCOUNTER — Encounter: Payer: Medicaid Other | Admitting: Obstetrics and Gynecology

## 2022-01-02 DIAGNOSIS — Z419 Encounter for procedure for purposes other than remedying health state, unspecified: Secondary | ICD-10-CM | POA: Diagnosis not present

## 2022-01-05 ENCOUNTER — Other Ambulatory Visit (HOSPITAL_COMMUNITY): Payer: Self-pay

## 2022-02-01 ENCOUNTER — Other Ambulatory Visit: Payer: Medicaid Other

## 2022-02-01 ENCOUNTER — Encounter: Payer: Self-pay | Admitting: Obstetrics and Gynecology

## 2022-02-01 ENCOUNTER — Ambulatory Visit (INDEPENDENT_AMBULATORY_CARE_PROVIDER_SITE_OTHER): Payer: Medicaid Other | Admitting: Obstetrics and Gynecology

## 2022-02-01 VITALS — BP 116/77 | HR 102 | Ht 64.0 in | Wt 179.0 lb

## 2022-02-01 DIAGNOSIS — Z8632 Personal history of gestational diabetes: Secondary | ICD-10-CM

## 2022-02-01 NOTE — Progress Notes (Signed)
[place] Partum Visit Note  Brandi Sanders is a 36 y.o. [id number] female who presents for a postpartum visit. She is 6 weeks postpartum following a normal spontaneous vaginal delivery.  I have fully reviewed the prenatal and intrapartum course. The delivery was at 59 gestational weeks.  Anesthesia: epidural. Postpartum course has been uncomplicated. Baby is doing well. Baby is feeding by bottle - Similac Advance. Bleeding moderate lochia. Bowel function is normal. Bladder function is normal. Patient is sexually active. Contraception method is Depo-Provera injections. Postpartum depression screening: negative.   The pregnancy intention screening data noted above was reviewed. Potential methods of contraception were discussed. The patient elected to proceed with No data recorded.   Edinburgh Postnatal Depression Scale - 02/01/22 0936       Edinburgh Postnatal Depression Scale:  In the Past 7 Days   I have been able to laugh and see the funny side of things. 0    I have looked forward with enjoyment to things. 0    I have blamed myself unnecessarily when things went wrong. 0    I have been anxious or worried for no good reason. 0    I have felt scared or panicky for no good reason. 0    Things have been getting on top of me. 0    I have been so unhappy that I have had difficulty sleeping. 0    I have felt sad or miserable. 0    I have been so unhappy that I have been crying. 0    The thought of harming myself has occurred to me. 0    Edinburgh Postnatal Depression Scale Total 0             Health Maintenance Due  Topic Date Due   COVID-19 Vaccine (1) Never done   INFLUENZA VACCINE  11/02/2021       Review of Systems Pertinent items noted in HPI and remainder of comprehensive ROS otherwise negative.  Objective:  BP 116/77   Pulse (!) 102   Ht 5\' 4"  (1.626 m)   Wt 179 lb (81.2 kg)   LMP 03/23/2021 (Exact Date)   Breastfeeding No   BMI 30.73 kg/m    General:  alert,  cooperative, and no distress   Breasts:  normal  Lungs: clear to auscultation bilaterally  Heart:  regular rate and rhythm  Abdomen: soft, non-tender; bowel sounds normal; no masses,  no organomegaly   Wound N/a  GU exam:  not indicated       Assessment:    1. Hx of gestational diabetes mellitus, not currently pregnant 2 hour glucola today   Normal postpartum exam.   Plan:   Essential components of care per ACOG recommendations:  1.  Mood and well being: Patient with negative depression screening today. Reviewed local resources for support.  - Patient tobacco use? No.   - hx of drug use? No.    2. Infant care and feeding:  -Patient currently breastmilk feeding? No.  -Social determinants of health (SDOH) reviewed in EPIC. No concerns  3. Sexuality, contraception and birth spacing - Patient does not want a pregnancy in the next year.  Desired family size is 3 children.  - Reviewed reproductive life planning. Reviewed contraceptive methods based on pt preferences and effectiveness.  Patient desired Hormonal Injection today.   - Discussed birth spacing of 18 months  4. Sleep and fatigue -Encouraged family/partner/community support of 4 hrs of uninterrupted sleep to help with mood  and fatigue  5. Physical Recovery  - Discussed patients delivery and complications. She describes her labor as good. - Patient had a Vaginal, no problems at delivery. Patient had no laceration. Perineal healing reviewed. Patient expressed understanding - Patient has urinary incontinence? No. - Patient is safe to resume physical and sexual activity  6.  Health Maintenance - HM due items addressed Yes - Last pap smear  Diagnosis  Date Value Ref Range Status  06/21/2021   Final   - Negative for intraepithelial lesion or malignancy (NILM)   Pap smear not done at today's visit.  -Breast Cancer screening indicated? No.   7. Chronic Disease/Pregnancy Condition follow up: Hypertension and is  established with [facility]  - PCP follow up  Mora Bellman, [place] for [place]

## 2022-02-02 DIAGNOSIS — Z419 Encounter for procedure for purposes other than remedying health state, unspecified: Secondary | ICD-10-CM | POA: Diagnosis not present

## 2022-02-02 LAB — GLUCOSE TOLERANCE, 2 HOURS
Glucose, 2 hour: 129 mg/dL (ref 70–139)
Glucose, GTT - Fasting: 102 mg/dL — ABNORMAL HIGH (ref 70–99)

## 2022-02-14 ENCOUNTER — Telehealth: Payer: Self-pay | Admitting: Emergency Medicine

## 2022-02-14 NOTE — Telephone Encounter (Signed)
TC to patient to discuss results

## 2022-03-04 DIAGNOSIS — Z419 Encounter for procedure for purposes other than remedying health state, unspecified: Secondary | ICD-10-CM | POA: Diagnosis not present

## 2022-03-14 ENCOUNTER — Ambulatory Visit (INDEPENDENT_AMBULATORY_CARE_PROVIDER_SITE_OTHER): Payer: Medicaid Other | Admitting: Family Medicine

## 2022-03-14 ENCOUNTER — Encounter: Payer: Self-pay | Admitting: Family Medicine

## 2022-03-14 VITALS — BP 134/103 | HR 86 | Ht 64.0 in | Wt 187.4 lb

## 2022-03-14 DIAGNOSIS — Z131 Encounter for screening for diabetes mellitus: Secondary | ICD-10-CM | POA: Insufficient documentation

## 2022-03-14 DIAGNOSIS — Z309 Encounter for contraceptive management, unspecified: Secondary | ICD-10-CM | POA: Diagnosis not present

## 2022-03-14 DIAGNOSIS — I10 Essential (primary) hypertension: Secondary | ICD-10-CM | POA: Diagnosis not present

## 2022-03-14 HISTORY — DX: Encounter for screening for diabetes mellitus: Z13.1

## 2022-03-14 LAB — POCT GLYCOSYLATED HEMOGLOBIN (HGB A1C): Hemoglobin A1C: 5.5 % (ref 4.0–5.6)

## 2022-03-14 MED ORDER — MEDROXYPROGESTERONE ACETATE 150 MG/ML IM SUSP
150.0000 mg | Freq: Once | INTRAMUSCULAR | Status: AC
  Administered 2022-03-14: 150 mg via INTRAMUSCULAR

## 2022-03-14 NOTE — Assessment & Plan Note (Signed)
-  given patient had gestational DM, screening performed today. A1c 5.5, at goal. -reassurance provided -diet and exercise counseling provided as we want to continue this trend and avoid developing DM

## 2022-03-14 NOTE — Assessment & Plan Note (Signed)
-  discussed importance of contraception as she prefers depo for contraception -patient due for her next depo injection today -no need for upreg given patient received depo within appropriate time window and prior to leaving hospital -follow up in 3 months for repeat injection administration

## 2022-03-14 NOTE — Progress Notes (Signed)
    SUBJECTIVE:   CHIEF COMPLAINT / HPI:   Patient presents for diabetes follow up. Recently delivered her daughter on 9/13 and doing well postpartum. She does not feel overwhelmed and reports good mood. Support person includes husband who is very involved and helps a significant amount. Formula feeding baby and she is doing well. She had gestational diabetes during her pregnancy and is here to check on that.   She is using depo for her birth control, last injection was 9/15 and is due for another one today.   History of hypertension, she has not taken her medication for the last 2 days since her work has been busy and has not had time to pick up her medications from pharmacy. On nifedipine which she takes twice. Denies chest pain, dyspnea or leg swelling.   OBJECTIVE:   BP (!) 134/103   Pulse 86   Ht 5\' 4"  (1.626 m)   Wt 187 lb 6.4 oz (85 kg)   SpO2 100%   Breastfeeding No   BMI 32.17 kg/m   General: Patient well-appearing, in no acute distress. CV: RRR, no murmurs or gallops auscultated Resp: CTAB, no wheezing, rales or rhonchi noted Abdomen: soft, nontender, nondistended, presence of bowel sounds Ext: no LE edema noted bilaterally  ASSESSMENT/PLAN:   Essential hypertension, benign -BP 134/103, not at goal of <130/90. Likely secondary to recent noncompliance as patient has not been able to pick up her medication due to her work schedule. Instructed her to do this at her earliest convenience, she will go do it after she leaves our visit.  -continue nifedipine, discussed that this is not ideal therapy to be on, will work on transitioning to possibly amlodipine daily at next visit -monitor BP and bring record to next visit   Diabetes mellitus screening -given patient had gestational DM, screening performed today. A1c 5.5, at goal. -reassurance provided -diet and exercise counseling provided as we want to continue this trend and avoid developing DM  Contraception  management -discussed importance of contraception as she prefers depo for contraception -patient due for her next depo injection today -no need for upreg given patient received depo within appropriate time window and prior to leaving hospital -follow up in 3 months for repeat injection administration  -PHQ-9 score of 0 reviewed and discussed.   , DO [place] [facility]

## 2022-03-14 NOTE — Patient Instructions (Addendum)
It was great seeing you today!  Congratulations on your little one! I am glad that things are going well! Your A1c is 5.5 which is very low and great news because you do not have diabetes now. Sometimes women only have diabetes during pregnancy which is what it seems like is the case for you. Please make sure to eat a balanced diet and stay physically active to ensure you do not develop diabetes in the future.   Please pick up your blood pressure medications and take these daily as prescribed.  You will receive your depo injection today and then another one in 3 months.   Please follow up at your next scheduled appointment in 2-3 months, if anything arises between now and then, please don't hesitate to contact our office.   Thank you for allowing [place] to be a part of your medical care!  Thank you, Dr. Robyne Peers  Also a reminder of our clinic's no-show policy. Please make sure to arrive at least 15 minutes prior to your scheduled appointment time. Please try to cancel before 24 hours if you are not able to make it. If you no-show for 2 appointments then you will be receiving a warning letter. If you no-show after 3 visits, then you may be at risk of being dismissed from our clinic. This is to ensure that everyone is able to be seen in a timely manner. Thank you, we appreciate your assistance with this!

## 2022-03-14 NOTE — Assessment & Plan Note (Signed)
-  BP 134/103, not at goal of <130/90. Likely secondary to recent noncompliance as patient has not been able to pick up her medication due to her work schedule. Instructed her to do this at her earliest convenience, she will go do it after she leaves our visit.  -continue nifedipine, discussed that this is not ideal therapy to be on, will work on transitioning to possibly amlodipine daily at next visit -monitor BP and bring record to next visit

## 2022-04-04 DIAGNOSIS — Z419 Encounter for procedure for purposes other than remedying health state, unspecified: Secondary | ICD-10-CM | POA: Diagnosis not present

## 2022-05-05 DIAGNOSIS — Z419 Encounter for procedure for purposes other than remedying health state, unspecified: Secondary | ICD-10-CM | POA: Diagnosis not present

## 2022-06-03 DIAGNOSIS — Z419 Encounter for procedure for purposes other than remedying health state, unspecified: Secondary | ICD-10-CM | POA: Diagnosis not present

## 2022-06-16 ENCOUNTER — Ambulatory Visit (INDEPENDENT_AMBULATORY_CARE_PROVIDER_SITE_OTHER): Payer: Medicaid Other

## 2022-06-16 DIAGNOSIS — Z308 Encounter for other contraceptive management: Secondary | ICD-10-CM | POA: Diagnosis not present

## 2022-06-16 LAB — POCT URINE PREGNANCY: Preg Test, Ur: NEGATIVE

## 2022-06-16 MED ORDER — MEDROXYPROGESTERONE ACETATE 150 MG/ML IM SUSY
150.0000 mg | PREFILLED_SYRINGE | Freq: Once | INTRAMUSCULAR | Status: AC
  Administered 2022-06-16: 150 mg via INTRAMUSCULAR

## 2022-06-16 NOTE — Progress Notes (Signed)
Patient here today for Depo Provera injection and is not within her dates.    Urine pregnancy test obtained and negative. Patient advised to use back up birth control for 7 days.  Last contraceptive appt was 03/14/2022.  Depo given in LD today.  Site unremarkable & patient tolerated injection.    Next injection due 09/01/2022-09/15/2022.  Reminder card given.

## 2022-06-16 NOTE — Progress Notes (Signed)
I have reviewed this visit and agree with the documentation.   

## 2022-07-04 DIAGNOSIS — Z419 Encounter for procedure for purposes other than remedying health state, unspecified: Secondary | ICD-10-CM | POA: Diagnosis not present

## 2022-07-15 ENCOUNTER — Telehealth: Payer: Self-pay | Admitting: Family Medicine

## 2022-07-15 NOTE — Telephone Encounter (Signed)
Outreached to patient to schedule apt with PCP to address Ventricular Septal Defect, DM and Gerd. LVM. AS, CMA

## 2022-08-03 DIAGNOSIS — Z419 Encounter for procedure for purposes other than remedying health state, unspecified: Secondary | ICD-10-CM | POA: Diagnosis not present

## 2022-09-03 DIAGNOSIS — Z419 Encounter for procedure for purposes other than remedying health state, unspecified: Secondary | ICD-10-CM | POA: Diagnosis not present

## 2022-10-03 ENCOUNTER — Ambulatory Visit (INDEPENDENT_AMBULATORY_CARE_PROVIDER_SITE_OTHER): Payer: Medicaid Other

## 2022-10-03 DIAGNOSIS — Z309 Encounter for contraceptive management, unspecified: Secondary | ICD-10-CM | POA: Diagnosis not present

## 2022-10-03 DIAGNOSIS — Z419 Encounter for procedure for purposes other than remedying health state, unspecified: Secondary | ICD-10-CM | POA: Diagnosis not present

## 2022-10-03 LAB — POCT URINE PREGNANCY: Preg Test, Ur: NEGATIVE

## 2022-10-03 MED ORDER — MEDROXYPROGESTERONE ACETATE 150 MG/ML IM SUSY
150.0000 mg | PREFILLED_SYRINGE | Freq: Once | INTRAMUSCULAR | Status: AC
Start: 2022-10-03 — End: 2022-10-03
  Administered 2022-10-03: 150 mg via INTRAMUSCULAR

## 2022-10-03 NOTE — Progress Notes (Signed)
Patient here today for Depo Provera injection and is not within her dates.  Urine pregnancy obtained and was negative. She denies sexual activity in the last month.   Last contraceptive appt was 03/14/22  Depo given in RD today, per patient's request.  Site unremarkable & patient tolerated injection. Advised patient to use back up contraception for at least 7 days.   Next injection due 12/19/22-01/02/23.  Reminder card given.    Veronda Prude, RN

## 2022-10-18 ENCOUNTER — Telehealth: Payer: Self-pay

## 2022-10-18 NOTE — Telephone Encounter (Signed)
LVM for patient to call back [phone], or to call PCP office to schedule follow up apt. AS, CMA  

## 2022-11-03 DIAGNOSIS — Z419 Encounter for procedure for purposes other than remedying health state, unspecified: Secondary | ICD-10-CM | POA: Diagnosis not present

## 2022-12-02 ENCOUNTER — Ambulatory Visit: Payer: Medicaid Other | Admitting: Family Medicine

## 2022-12-02 ENCOUNTER — Encounter: Payer: Self-pay | Admitting: Family Medicine

## 2022-12-02 VITALS — BP 137/99 | HR 99 | Ht 64.0 in | Wt 183.4 lb

## 2022-12-02 DIAGNOSIS — Z720 Tobacco use: Secondary | ICD-10-CM | POA: Diagnosis not present

## 2022-12-02 DIAGNOSIS — I10 Essential (primary) hypertension: Secondary | ICD-10-CM | POA: Diagnosis not present

## 2022-12-02 MED ORDER — AMLODIPINE BESYLATE 5 MG PO TABS
5.0000 mg | ORAL_TABLET | Freq: Every day | ORAL | 3 refills | Status: DC
Start: 2022-12-02 — End: 2022-12-20

## 2022-12-02 NOTE — Assessment & Plan Note (Signed)
-   Started amlodipine 5 mg daily today - Discussed potential side effects with patient including swelling, dizziness, GI upset.  Patient advised to call the office immediately if she starts experiencing any swelling.  Also told her to let me know if she continues to have negative side effects with this medication after taking it for 1 to 2 weeks. - Will keep a BP log and follow-up with me in 4 weeks - Advised patient that she only needs to check her blood pressures in the morning a couple times a week.

## 2022-12-02 NOTE — Patient Instructions (Addendum)
Your goal blood pressure is less than 130/90.  Check your blood pressure several times a week in the mornings.  If regularly higher than this please let me know - either with MyChart or leaving a phone message. Next visit please bring in your blood pressure log   Good to see you today - Thank you for coming in  You are starting a new blood pressure medication today called amlodipine.  If you begin having any negative side effects, or any signs of swelling, please call us immediately.  Follow back up with me in 4 to 6 weeks to recheck your blood pressures.

## 2022-12-02 NOTE — Assessment & Plan Note (Signed)
Congratulated patient on quitting smoking.  She is down to 4 cigarettes a day.  States that her and her husband are quitting together.

## 2022-12-02 NOTE — Progress Notes (Signed)
    SUBJECTIVE:   CHIEF COMPLAINT / HPI:   Hypertension: Patient is here to follow-up on high blood pressures.  She saw Dr. Robyne Peers in December and had an elevated blood pressure at; that time of 134/103.  Of note, patient had a baby 11 months ago and was taking nifedipine during pregnancy. She has not been taking it since then. Patient does not have her blood pressure log with her, however states that her pressures have been running in the 130s over 90s at home.  She has been taking her blood pressure in the morning, afternoon, evening.  Patient says that she suspected she would need to start blood pressure medicine today.  Patient is actively quitting smoking.  She is down to 4 cigarettes a day.  PERTINENT  PMH / PSH: none  OBJECTIVE:   BP (!) 137/99   Pulse 99   Ht 5\' 4"  (1.626 m)   Wt 183 lb 6.4 oz (83.2 kg)   LMP  (Exact Date)   SpO2 100%   BMI 31.48 kg/m   Gen: Well-appearing, no acute distress CV: Regular rate and rhythm, no murmurs Pulm: No increased work of breathing, clear to auscultation bilaterally MSK: No leg swelling bilaterally  ASSESSMENT/PLAN:   Essential hypertension, benign - Started amlodipine 5 mg daily today - Discussed potential side effects with patient including swelling, dizziness, GI upset.  Patient advised to call the office immediately if she starts experiencing any swelling.  Also told her to let me know if she continues to have negative side effects with this medication after taking it for 1 to 2 weeks. - Will keep a BP log and follow-up with me in 4 weeks - Advised patient that she only needs to check her blood pressures in the morning a couple times a week.  Tobacco use Congratulated patient on quitting smoking.  She is down to 4 cigarettes a day.  States that her and her husband are quitting together.    Para March, DO [facility]

## 2022-12-04 DIAGNOSIS — Z419 Encounter for procedure for purposes other than remedying health state, unspecified: Secondary | ICD-10-CM | POA: Diagnosis not present

## 2022-12-20 ENCOUNTER — Telehealth: Payer: Self-pay

## 2022-12-20 MED ORDER — AMLODIPINE BESYLATE 5 MG PO TABS: 5.0000 mg | ORAL_TABLET | Freq: Every day | ORAL | 3 refills | Status: DC

## 2022-12-20 NOTE — Telephone Encounter (Signed)
Patient calls nurse line in regards to Amlodipine prescription.   She reports difficulty picking this up.   [place] is not enrolled in medicaid just yet.   Will forward to afternoon preceptor to send in.

## 2022-12-21 ENCOUNTER — Encounter: Payer: Self-pay | Admitting: Pharmacist

## 2022-12-30 ENCOUNTER — Ambulatory Visit: Payer: Medicaid Other | Admitting: Family Medicine

## 2023-01-03 DIAGNOSIS — Z419 Encounter for procedure for purposes other than remedying health state, unspecified: Secondary | ICD-10-CM | POA: Diagnosis not present

## 2023-02-03 DIAGNOSIS — Z419 Encounter for procedure for purposes other than remedying health state, unspecified: Secondary | ICD-10-CM | POA: Diagnosis not present

## 2023-03-05 DIAGNOSIS — Z419 Encounter for procedure for purposes other than remedying health state, unspecified: Secondary | ICD-10-CM | POA: Diagnosis not present

## 2023-04-05 DIAGNOSIS — Z419 Encounter for procedure for purposes other than remedying health state, unspecified: Secondary | ICD-10-CM | POA: Diagnosis not present

## 2023-05-06 DIAGNOSIS — Z419 Encounter for procedure for purposes other than remedying health state, unspecified: Secondary | ICD-10-CM | POA: Diagnosis not present

## 2023-06-03 DIAGNOSIS — Z419 Encounter for procedure for purposes other than remedying health state, unspecified: Secondary | ICD-10-CM | POA: Diagnosis not present

## 2023-06-28 ENCOUNTER — Emergency Department (HOSPITAL_COMMUNITY)
Admission: EM | Admit: 2023-06-28 | Discharge: 2023-06-28 | Disposition: A | Discharge status: Home / Self Care | Attending: Emergency Medicine | Admitting: Emergency Medicine

## 2023-06-28 ENCOUNTER — Encounter (HOSPITAL_COMMUNITY): Payer: Self-pay

## 2023-06-28 ENCOUNTER — Emergency Department (HOSPITAL_COMMUNITY)

## 2023-06-28 ENCOUNTER — Other Ambulatory Visit: Payer: Self-pay

## 2023-06-28 DIAGNOSIS — I10 Essential (primary) hypertension: Secondary | ICD-10-CM | POA: Diagnosis not present

## 2023-06-28 DIAGNOSIS — K5730 Diverticulosis of large intestine without perforation or abscess without bleeding: Secondary | ICD-10-CM | POA: Diagnosis not present

## 2023-06-28 DIAGNOSIS — D72829 Elevated white blood cell count, unspecified: Secondary | ICD-10-CM | POA: Diagnosis not present

## 2023-06-28 DIAGNOSIS — K5792 Diverticulitis of intestine, part unspecified, without perforation or abscess without bleeding: Secondary | ICD-10-CM

## 2023-06-28 DIAGNOSIS — K5732 Diverticulitis of large intestine without perforation or abscess without bleeding: Secondary | ICD-10-CM | POA: Diagnosis not present

## 2023-06-28 DIAGNOSIS — R1032 Left lower quadrant pain: Secondary | ICD-10-CM

## 2023-06-28 DIAGNOSIS — R109 Unspecified abdominal pain: Secondary | ICD-10-CM | POA: Diagnosis present

## 2023-06-28 LAB — COMPREHENSIVE METABOLIC PANEL
ALT: 15 U/L (ref 0–44)
AST: 23 U/L (ref 15–41)
Albumin: 3.7 g/dL (ref 3.5–5.0)
Alkaline Phosphatase: 45 U/L (ref 38–126)
Anion gap: 10 (ref 5–15)
BUN: 9 mg/dL (ref 6–20)
CO2: 23 mmol/L (ref 22–32)
Calcium: 9.4 mg/dL (ref 8.9–10.3)
Chloride: 102 mmol/L (ref 98–111)
Creatinine, Ser: 0.82 mg/dL (ref 0.44–1.00)
GFR, Estimated: >60 mL/min (ref >60)
Glucose, Bld: 104 mg/dL — ABNORMAL HIGH (ref 70–99)
Potassium: 3.8 mmol/L (ref 3.5–5.1)
Sodium: 135 mmol/L (ref 135–145)
Total Bilirubin: 0.4 mg/dL (ref 0.0–1.2)
Total Protein: 7.3 g/dL (ref 6.5–8.1)

## 2023-06-28 LAB — URINALYSIS, ROUTINE W REFLEX MICROSCOPIC
Bilirubin Urine: NEGATIVE
Glucose, UA: NEGATIVE mg/dL
Hgb urine dipstick: NEGATIVE
Ketones, ur: NEGATIVE mg/dL
Leukocytes,Ua: NEGATIVE
Nitrite: NEGATIVE
Protein, ur: 100 mg/dL — AB
Specific Gravity, Urine: 1.023 (ref 1.005–1.030)
pH: 6 (ref 5.0–8.0)

## 2023-06-28 LAB — CBC
HCT: 38.4 % (ref 36.0–46.0)
Hemoglobin: 13 g/dL (ref 12.0–15.0)
MCH: 31.1 pg (ref 26.0–34.0)
MCHC: 33.9 g/dL (ref 30.0–36.0)
MCV: 91.9 fL (ref 80.0–100.0)
Platelets: 227 10*3/uL (ref 150–400)
RBC: 4.18 MIL/uL (ref 3.87–5.11)
RDW: 13.8 % (ref 11.5–15.5)
WBC: 11.3 10*3/uL — ABNORMAL HIGH (ref 4.0–10.5)
nRBC: 0 % (ref 0.0–0.2)

## 2023-06-28 LAB — LIPASE, BLOOD: Lipase: 25 U/L (ref 11–51)

## 2023-06-28 LAB — HCG, SERUM, QUALITATIVE: Preg, Serum: NEGATIVE

## 2023-06-28 MED ORDER — SODIUM CHLORIDE 0.9 % IV BOLUS
1000.0000 mL | Freq: Once | INTRAVENOUS | Status: AC
  Administered 2023-06-28: 1000 mL via INTRAVENOUS

## 2023-06-28 MED ORDER — AMOXICILLIN-POT CLAVULANATE 875-125 MG PO TABS
1.0000 | ORAL_TABLET | Freq: Once | ORAL | Status: AC
  Administered 2023-06-28: 1 via ORAL
  Filled 2023-06-28: qty 1

## 2023-06-28 MED ORDER — IOHEXOL 350 MG/ML SOLN
75.0000 mL | Freq: Once | INTRAVENOUS | Status: AC | PRN
  Administered 2023-06-28: 75 mL via INTRAVENOUS

## 2023-06-28 MED ORDER — DICYCLOMINE HCL 10 MG PO CAPS
10.0000 mg | ORAL_CAPSULE | Freq: Once | ORAL | Status: AC
  Administered 2023-06-28: 10 mg via ORAL
  Filled 2023-06-28: qty 1

## 2023-06-28 MED ORDER — AMOXICILLIN-POT CLAVULANATE 875-125 MG PO TABS: 1.0000 | ORAL_TABLET | Freq: Two times a day (BID) | ORAL | 0 refills | Status: DC

## 2023-06-28 MED ORDER — MORPHINE SULFATE (PF) 4 MG/ML IV SOLN
4.0000 mg | Freq: Once | INTRAVENOUS | Status: AC
  Administered 2023-06-28: 4 mg via INTRAVENOUS
  Filled 2023-06-28: qty 1

## 2023-06-28 NOTE — ED Provider Notes (Signed)
 [place] EMERGENCY DEPARTMENT AT [facility] Provider Note   CSN: [id number] Arrival date & time: 06/28/23  1724     History  Chief Complaint  Patient presents with   Abdominal Pain    Brandi Sanders is a 38 y.o. female with overall noncontributory past medical history who presents with concern for left lower quadrant abdominal pain that started around 3 days ago and has been worsening since then.  She reports that she did have a remote history of some hernia repair but no complications since then.  She denies any diarrhea, constipation, nausea, vomiting.  She denies any fever, chills, dysuria, vaginal bleeding.  No previous similar abdominal pain.  She denies any recent dietary changes.   Abdominal Pain      Home Medications Prior to Admission medications   Medication Sig Start Date End Date Taking? Authorizing Provider  amLODipine (NORVASC) 5 MG tablet Take 1 tablet (5 mg total) by mouth at bedtime. 12/20/22   Caro Laroche, DO      Allergies    Bee pollen    Review of Systems   Review of Systems  Gastrointestinal:  Positive for abdominal pain.  All other systems reviewed and are negative.   Physical Exam Updated Vital Signs BP (!) 147/95 (BP Location: Right Arm)   Pulse (!) 115   Temp 98.8 F (37.1 C) (Oral)   Resp 18   Ht 5\' 4"  (1.626 m)   Wt 77.1 kg   LMP 05/26/2023 (Approximate)   SpO2 100%   Breastfeeding No   BMI 29.18 kg/m  Physical Exam Vitals and nursing note reviewed.  Constitutional:      General: She is not in acute distress.    Appearance: Normal appearance.  HENT:     Head: Normocephalic and atraumatic.  Eyes:     General:        Right eye: No discharge.        Left eye: No discharge.  Cardiovascular:     Rate and Rhythm: Regular rhythm. Tachycardia present.     Heart sounds: No murmur heard.    No friction rub. No gallop.  Pulmonary:     Effort: Pulmonary effort is normal.     Breath sounds: Normal breath sounds.   Abdominal:     General: Bowel sounds are normal.     Palpations: Abdomen is soft.     Comments: Focal tenderness in the left lower quadrant, some guarding, no rebound, rigidity  Skin:    General: Skin is warm and dry.     Capillary Refill: Capillary refill takes less than 2 seconds.  Neurological:     Mental Status: She is alert and oriented to person, place, and time.  Psychiatric:        Mood and Affect: Mood normal.        Behavior: Behavior normal.     ED Results / Procedures / Treatments   Labs (all labs ordered are listed, but only abnormal results are displayed) Labs Reviewed  COMPREHENSIVE METABOLIC PANEL - Abnormal; Notable for the following components:      Result Value   Glucose, Bld 104 (*)    All other components within normal limits  CBC - Abnormal; Notable for the following components:   WBC 11.3 (*)    All other components within normal limits  URINALYSIS, ROUTINE W REFLEX MICROSCOPIC - Abnormal; Notable for the following components:   Protein, ur 100 (*)    Bacteria, UA RARE (*)  All other components within normal limits  LIPASE, BLOOD  HCG, SERUM, QUALITATIVE    EKG None  Radiology No results found.  Procedures Procedures    Medications Ordered in ED Medications - No data to display  ED Course/ Medical Decision Making/ A&P                                 Medical Decision Making Amount and/or Complexity of Data Reviewed Labs: ordered. Radiology: ordered.  Risk Prescription drug management.   This patient is a 38 y.o. female  who presents to the ED for concern of abdominal pain, without nausea, or vomiting.   Differential diagnoses prior to evaluation: The emergent differential diagnosis includes, but is not limited to,  The causes of generalized abdominal pain include but are not limited to AAA, mesenteric ischemia, appendicitis, diverticulitis, DKA, gastritis, gastroenteritis, AMI, nephrolithiasis, pancreatitis, peritonitis, adrenal  insufficiency,lead poisoning, iron toxicity, intestinal ischemia, constipation, UTI,SBO/LBO, splenic rupture, biliary disease, IBD, IBS, PUD, or hepatitis, Ectopic pregnancy, ovarian torsion, PID. Marland Kitchen This is not an exhaustive differential.   Past Medical History / Co-morbidities / Social History: Hypertension, tobacco use, obesity, previous hernia repair, otherwise overall noncontributory  Additional history: Chart reviewed. Pertinent results include: Reviewed outpatient family medicine visits, lab work, imaging from remote previous emergency department visits.  Physical Exam: Physical exam performed. The pertinent findings include: Patient with mild tachycardia, pulse 115 on arrival, elevated blood pressure 147/95, suspect likely secondary to pain.  Lab Tests/Imaging studies: I personally interpreted labs/imaging and the pertinent results include: CBC with small leukocytosis, white blood cells 11.3, CMP unremarkable, lipase normal, negative serum pregnancy test, UA with rare bacteria and some protein, no evidence of acute urinary tract infection. CT not performed prior to handoff.   Medications: I ordered medication including morphine, bentyl, bolus.  I have reviewed the patients home medicines and have made adjustments as needed.   9:32 PM Care of Brandi Sanders transferred to Dr. Deretha Emory at the end of my shift as the patient will require reassessment once labs/imaging have resulted. Patient presentation, ED course, and plan of care discussed with review of all pertinent labs and imaging. Please see his/her note for further details regarding further ED course and disposition. Plan at time of handoff is pending reevaluation of pain, CT abdomen pelvis with contrast. This may be altered or completely changed at the discretion of the oncoming team pending results of further workup.  Final Clinical Impression(s) / ED Diagnoses Final diagnoses:  None    Rx / DC Orders ED Discharge Orders      None         [place] 06/28/23 2133    Vanetta Mulders, MD 06/28/23 [phone]

## 2023-06-28 NOTE — Discharge Instructions (Signed)
 Take the antibiotic Augmentin as directed.  Would expect improvement over the next couple days.  Recommend clear liquid diet at least for the next 24 hours then advance to a bland diet.  Return for any new or worse symptoms which include worse abdominal pain any persistent vomiting or fevers.  Make an appointment to follow-up with your primary care doctor.  Usually once this is healed I will make arrangements for you to have a colonoscopy.  [facility] is on-call for here tonight.  Will give you there office number to set up an appointment.

## 2023-06-28 NOTE — ED Triage Notes (Signed)
 Pt reports LLQ abd pain x 2 days ago associated with reports of abdominal swelling. Denies n/v/d. Denies any other symptoms except the pain in her abdomen.

## 2023-06-30 ENCOUNTER — Encounter: Payer: Self-pay | Admitting: Physician Assistant

## 2023-07-06 ENCOUNTER — Encounter: Payer: Self-pay | Admitting: Family Medicine

## 2023-07-06 ENCOUNTER — Ambulatory Visit: Admitting: Family Medicine

## 2023-07-06 VITALS — BP 142/97 | HR 100 | Ht 64.0 in | Wt 176.2 lb

## 2023-07-06 DIAGNOSIS — I10 Essential (primary) hypertension: Secondary | ICD-10-CM | POA: Diagnosis not present

## 2023-07-06 DIAGNOSIS — K5792 Diverticulitis of intestine, part unspecified, without perforation or abscess without bleeding: Secondary | ICD-10-CM | POA: Diagnosis present

## 2023-07-06 MED ORDER — FLUTICASONE PROPIONATE 50 MCG/ACT NA SUSP: 2.0000 | Freq: Every day | NASAL | 6 refills | Status: AC

## 2023-07-06 MED ORDER — AMLODIPINE BESYLATE 5 MG PO TABS: 5.0000 mg | ORAL_TABLET | Freq: Every day | ORAL | 3 refills | Status: DC

## 2023-07-06 NOTE — Assessment & Plan Note (Addendum)
 Uncontrolled, previously prescribed amlodipine 5 mg but patient was unable to pick it up at that time. - Amlodipine 5 mg daily - Keep BP log prior to next appointment - Advised to look out for side effects such as swelling

## 2023-07-06 NOTE — Patient Instructions (Signed)
 It was great to see you today! Thank you for choosing [facility] for your primary care. Brandi Sanders was seen for hospital follow up.  Today we addressed: I am glad you are feeling better.  Please finish your antibiotics.  Follow-up with GI as scheduled on May 23. Pick up amlodipine 5 mg to take daily for your blood pressure, I will see you next week.  Try to keep a blood pressure log at home, you can take it in the mornings and then it down.  Your goal includes less than 130/80 If you develop any swelling please stop taking the medication and call the office  If you haven't already, sign up for My Chart to have easy access to your labs results, and communication with your primary care physician.  Call the clinic at [phone] if your symptoms worsen or you have any concerns.  You should return to our clinic in 1 week for BP follow up Please arrive 15 minutes before your appointment to ensure smooth check in process.  We appreciate your efforts in making this happen.  Thank you for allowing me to participate in your care, Para March, DO 07/06/2023, 3:04 PM PGY-1, [facility]

## 2023-07-06 NOTE — Progress Notes (Signed)
    SUBJECTIVE:   CHIEF COMPLAINT / HPI:   Seen in ED 3/26 and diagnosed with uncomplicated diverticulitis, was prescribed Augmentin States her symptoms have resolved, she is back to eating a normal diet Has appointment with GI next month  Was unable to pick up blood pressure medicine at her last visit but would like to start it now  PERTINENT  PMH / PSH: HTN, allergic rhinitis, episodic mood disorder  OBJECTIVE:   BP (!) 142/97   Pulse 100   Ht 5\' 4"  (1.626 m)   Wt 176 lb 4 oz (79.9 kg)   LMP 05/26/2023 (Approximate)   SpO2 99%   BMI 30.25 kg/m   Gen: well appearing, no acute distress CV: RRR, no murmurs Abd: normal bowel sounds, soft, non-tender  ASSESSMENT/PLAN:   Assessment & Plan Diverticulitis Resolved. CT AP from 3/26 showing acute diverticulitis, treated with Augmentin by ED physician. She has two more doses left. No hx diverticulitis.  - Finish course of Augmentin - F/u with GI as scheduled on 5/23 for colonoscopy - High fiber diet Essential hypertension, benign Uncontrolled, previously prescribed amlodipine 5 mg but patient was unable to pick it up at that time. - Amlodipine 5 mg daily - Keep BP log prior to next appointment - Advised to look out for side effects such as swelling   Para March, DO [place] [facility]

## 2023-07-06 NOTE — Assessment & Plan Note (Addendum)
 Resolved. CT AP from 3/26 showing acute diverticulitis, treated with Augmentin by ED physician. She has two more doses left. No hx diverticulitis.  - Finish course of Augmentin - F/u with GI as scheduled on 5/23 for colonoscopy - High fiber diet

## 2023-07-13 ENCOUNTER — Encounter: Payer: Self-pay | Admitting: Family Medicine

## 2023-07-13 ENCOUNTER — Ambulatory Visit: Payer: Self-pay | Admitting: Family Medicine

## 2023-07-13 VITALS — BP 133/98 | HR 100 | Ht 64.0 in | Wt 177.4 lb

## 2023-07-13 DIAGNOSIS — I10 Essential (primary) hypertension: Secondary | ICD-10-CM

## 2023-07-13 NOTE — Assessment & Plan Note (Signed)
 Blood pressure elevated at 133/98, unable to recheck as patient was going straight back to work.  She is tolerating amlodipine well.  Utilizing shared decision making with patient, decided to continue amlodipine 5 mg for 1 month and recheck her blood pressure again at that time.  She has only been taking the amlodipine for 1 week.  She is in the process of getting a blood pressure cuff and will check it at home so she can bring a blood pressure log back at her next appointment. - Return precautions

## 2023-07-13 NOTE — Progress Notes (Signed)
    SUBJECTIVE:   CHIEF COMPLAINT / HPI:   Seen by me 07/06/23 for diverticulitis follow up, started amlodipine 5mg  at that time due to multiple elevated blood pressure readings at multiple office visits Here for BP f/u Patient currently working on getting a blood pressure cuff No side effects from amlodipine, tolerating well  PERTINENT  PMH / PSH: HTN, allergic rhinitis, episodic mood disorder, diverticulitis  OBJECTIVE:   BP (!) 133/98   Pulse 100   Ht 5\' 4"  (1.626 m)   Wt 177 lb 6.4 oz (80.5 kg)   LMP 05/26/2023 (Approximate)   SpO2 98%   BMI 30.45 kg/m   GEN: Well-appearing, no acute distress Ambulating independently  ASSESSMENT/PLAN:   Assessment & Plan Essential hypertension, benign Blood pressure elevated at 133/98, unable to recheck as patient was going straight back to work.  She is tolerating amlodipine well.  Utilizing shared decision making with patient, decided to continue amlodipine 5 mg for 1 month and recheck her blood pressure again at that time.  She has only been taking the amlodipine for 1 week.  She is in the process of getting a blood pressure cuff and will check it at home so she can bring a blood pressure log back at her next appointment. - Return precautions     Para March, DO [facility]

## 2023-07-13 NOTE — Patient Instructions (Signed)
 Good to see you today - Thank you for coming in  Things we discussed today: Your goal blood pressure is less than 135/85  Check your blood pressure several times a week.  If regularly higher than this please let me know - either with MyChart or leaving a phone message. Next visit please bring in your blood pressure cuff.      Please always bring your medication bottles  Come back to see me in 1 month

## 2023-07-15 DIAGNOSIS — Z419 Encounter for procedure for purposes other than remedying health state, unspecified: Secondary | ICD-10-CM | POA: Diagnosis not present

## 2023-08-14 DIAGNOSIS — Z419 Encounter for procedure for purposes other than remedying health state, unspecified: Secondary | ICD-10-CM | POA: Diagnosis not present
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
## 2023-08-25 ENCOUNTER — Encounter: Payer: Self-pay | Admitting: Physician Assistant

## 2023-08-25 ENCOUNTER — Ambulatory Visit (INDEPENDENT_AMBULATORY_CARE_PROVIDER_SITE_OTHER): Admitting: Physician Assistant

## 2023-08-25 VITALS — BP 128/86 | HR 89 | Ht 64.0 in | Wt 180.4 lb

## 2023-08-25 DIAGNOSIS — R198 Other specified symptoms and signs involving the digestive system and abdomen: Secondary | ICD-10-CM

## 2023-08-25 DIAGNOSIS — R194 Change in bowel habit: Secondary | ICD-10-CM | POA: Diagnosis not present

## 2023-08-25 DIAGNOSIS — Z8719 Personal history of other diseases of the digestive system: Secondary | ICD-10-CM

## 2023-08-25 MED ORDER — NA SULFATE-K SULFATE-MG SULF 17.5-3.13-1.6 GM/177ML PO SOLN: 1.0000 | Freq: Once | ORAL | 0 refills | Status: AC

## 2023-08-25 NOTE — Patient Instructions (Addendum)
   Start OTC Benefiber Powder. Mix 1 - 2 Tablespoons in 6 - 8 ounces of a Drink Once Daily. Drink 64 ounces of water / fluids Daily.   You have been scheduled for a colonoscopy. Please follow written instructions given to you at your visit today.   If you use inhalers (even only as needed), please bring them with you on the day of your procedure.  DO NOT TAKE 7 DAYS PRIOR TO TEST- Trulicity (dulaglutide) Ozempic, Wegovy (semaglutide) Mounjaro (tirzepatide) Bydureon Bcise (exanatide extended release)  DO NOT TAKE 1 DAY PRIOR TO YOUR TEST Rybelsus (semaglutide) Adlyxin (lixisenatide) Victoza (liraglutide) Byetta (exanatide) ___________________________________________________________________________  Please follow up sooner if symptoms increase or worsen ___________________________________________________________________________________ Thank you for trusting me with your gastrointestinal care!   [name], PA-C  Due to recent changes in healthcare laws, you may see the results of your imaging and laboratory studies on MyChart before your provider has had a chance to review them.  We understand that in some cases there may be results that are confusing or concerning to you. Not all laboratory results come back in the same time frame and the provider may be waiting for multiple results in order to interpret others.  Please give us  48 hours in order for your provider to thoroughly review all the results before contacting the office for clarification of your results.   _______________________________________________________  If your blood pressure at your visit was 140/90 or greater, please contact your primary care physician to follow up on this.  _______________________________________________________  If you are age 78 or older, your body mass index should be between 23-30. Your Body mass index is 30.96 kg/m. If this is out of the aforementioned range listed, please consider  follow up with your Primary Care Provider.  If you are age 39 or younger, your body mass index should be between 19-25. Your Body mass index is 30.96 kg/m. If this is out of the aformentioned range listed, please consider follow up with your Primary Care Provider.   ________________________________________________________  The [place] [facility] providers would like to encourage you to use MYCHART to communicate with providers for non-urgent requests or questions.  Due to long hold times on the telephone, sending your provider a message by [facility] may be a faster and more efficient way to get a response.  Please allow 48 business hours for a response.  Please remember that this is for non-urgent requests.  _______________________________________________________

## 2023-09-12 NOTE — Progress Notes (Signed)
 Agree with the assessment and plan as outlined by [name], PA-C.

## 2023-09-14 DIAGNOSIS — Z419 Encounter for procedure for purposes other than remedying health state, unspecified: Secondary | ICD-10-CM | POA: Diagnosis not present

## 2023-10-05 ENCOUNTER — Ambulatory Visit: Admitting: Gastroenterology

## 2023-10-05 ENCOUNTER — Encounter: Payer: Self-pay | Admitting: Gastroenterology

## 2023-10-05 VITALS — BP 129/86 | HR 93 | Temp 98.0°F | Resp 19 | Ht 64.0 in | Wt 180.0 lb

## 2023-10-05 DIAGNOSIS — K635 Polyp of colon: Secondary | ICD-10-CM | POA: Diagnosis not present

## 2023-10-05 DIAGNOSIS — D125 Benign neoplasm of sigmoid colon: Secondary | ICD-10-CM

## 2023-10-05 DIAGNOSIS — Z1211 Encounter for screening for malignant neoplasm of colon: Secondary | ICD-10-CM

## 2023-10-05 DIAGNOSIS — I10 Essential (primary) hypertension: Secondary | ICD-10-CM | POA: Diagnosis not present

## 2023-10-05 DIAGNOSIS — Z8719 Personal history of other diseases of the digestive system: Secondary | ICD-10-CM

## 2023-10-05 DIAGNOSIS — R198 Other specified symptoms and signs involving the digestive system and abdomen: Secondary | ICD-10-CM

## 2023-10-05 DIAGNOSIS — Z09 Encounter for follow-up examination after completed treatment for conditions other than malignant neoplasm: Secondary | ICD-10-CM | POA: Diagnosis not present

## 2023-10-05 DIAGNOSIS — D122 Benign neoplasm of ascending colon: Secondary | ICD-10-CM | POA: Diagnosis not present

## 2023-10-05 DIAGNOSIS — K5730 Diverticulosis of large intestine without perforation or abscess without bleeding: Secondary | ICD-10-CM | POA: Diagnosis not present

## 2023-10-05 DIAGNOSIS — F32A Depression, unspecified: Secondary | ICD-10-CM | POA: Diagnosis not present

## 2023-10-05 MED ORDER — SODIUM CHLORIDE 0.9 % IV SOLN: 500.0000 mL | INTRAVENOUS | Status: DC

## 2023-10-05 NOTE — Progress Notes (Signed)
 Sedate, gd SR, tolerated procedure well, VSS, report to RN
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
## 2023-10-05 NOTE — Op Note (Signed)
 [facility] Patient Name: Brandi Sanders Procedure Date: 10/05/2023 8:54 AM MRN: [id number] Endoscopist: Glendia E. Stacia , MD, [id number] Age: 38 Referring MD:  Date of Birth: [birth year] Gender: Female Account #: [id number] Procedure:                Colonoscopy Indications:              Follow-up of diverticulitis Medicines:                Monitored Anesthesia Care Procedure:                Pre-Anesthesia Assessment:                           - Prior to the procedure, a History and Physical                            was performed, and patient medications and                            allergies were reviewed. The patient's tolerance of                            previous anesthesia was also reviewed. The risks                            and benefits of the procedure and the sedation                            options and risks were discussed with the patient.                            All questions were answered, and informed consent                            was obtained. Prior Anticoagulants: The patient has                            taken no anticoagulant or antiplatelet agents. ASA                            Grade Assessment: II - A patient with mild systemic                            disease. After reviewing the risks and benefits,                            the patient was deemed in satisfactory condition to                            undergo the procedure.                           After obtaining informed consent, the colonoscope  was passed under direct vision. Throughout the                            procedure, the patient's blood pressure, pulse, and                            oxygen saturations were monitored continuously. The                            Olympus CF-HQ190L ([id number]) Colonoscope was                            introduced through the anus and advanced to the the                            cecum, identified by  appendiceal orifice and                            ileocecal valve. The colonoscopy was performed                            without difficulty. The patient tolerated the                            procedure well. The quality of the bowel                            preparation was good. The ileocecal valve,                            appendiceal orifice, and rectum were photographed.                            The bowel preparation used was SUPREP via split                            dose instruction. Scope In: 9:00:13 AM Scope Out: 9:16:17 AM Scope Withdrawal Time: 0 hours 10 minutes 8 seconds  Total Procedure Duration: 0 hours 16 minutes 4 seconds  Findings:                 The perianal and digital rectal examinations were                            normal. Pertinent negatives include normal                            sphincter tone and no palpable rectal lesions.                           A 7 mm polyp was found in the ascending colon. The                            polyp was flat. The polyp was removed with a cold  snare. Resection and retrieval were complete.                            Estimated blood loss was minimal.                           A 3 mm polyp was found in the sigmoid colon. The                            polyp was sessile. The polyp was removed with a                            cold snare. Resection and retrieval were complete.                            Estimated blood loss was minimal.                           Multiple medium-mouthed and small-mouthed                            diverticula were found in the sigmoid colon,                            descending colon and transverse colon.                           The exam was otherwise normal throughout the                            examined colon.                           The retroflexed view of the distal rectum and anal                            verge was normal and showed no anal or  rectal                            abnormalities. Complications:            No immediate complications. Estimated Blood Loss:     Estimated blood loss was minimal. Impression:               - One 7 mm polyp in the ascending colon, removed                            with a cold snare. Resected and retrieved.                           - One 3 mm polyp in the sigmoid colon, removed with                            a cold snare. Resected and retrieved.                           -  Moderate diverticulosis in the sigmoid colon, in                            the descending colon and in the transverse colon.                           - The distal rectum and anal verge are normal on                            retroflexion view. Recommendation:           - Patient has a contact number available for                            emergencies. The signs and symptoms of potential                            delayed complications were discussed with the                            patient. Return to normal activities tomorrow.                            Written discharge instructions were provided to the                            patient.                           - Resume previous diet.                           - Continue present medications.                           - Await pathology results.                           - Repeat colonoscopy (date not yet determined) for                            surveillance based on pathology results.                           - Recommend daily fiber supplement to reduce risk                            of recurrent diverticulitis. Kelii Chittum E. Stacia, MD 10/05/2023 9:21:26 AM This report has been signed electronically.

## 2023-10-05 NOTE — Patient Instructions (Signed)
 Resume previous diet Continue present medications Await pathology results Recommend daily fiber supplement to reduce risk of recurrent diverticulitis     YOU HAD AN ENDOSCOPIC PROCEDURE TODAY AT THE [facility]:   Refer to the procedure report that was given to you for any specific questions about what was found during the examination.  If the procedure report does not answer your questions, please call your gastroenterologist to clarify.  If you requested that your care partner not be given the details of your procedure findings, then the procedure report has been included in a sealed envelope for you to review at your convenience later.  YOU SHOULD EXPECT: Some feelings of bloating in the abdomen. Passage of more gas than usual.  Walking can help get rid of the air that was put into your GI tract during the procedure and reduce the bloating. If you had a lower endoscopy (such as a colonoscopy or flexible sigmoidoscopy) you may notice spotting of blood in your stool or on the toilet paper. If you underwent a bowel prep for your procedure, you may not have a normal bowel movement for a few days.  Please Note:  You might notice some irritation and congestion in your nose or some drainage.  This is from the oxygen used during your procedure.  There is no need for concern and it should clear up in a day or so.  SYMPTOMS TO REPORT IMMEDIATELY:  Following lower endoscopy (colonoscopy or flexible sigmoidoscopy):  Excessive amounts of blood in the stool  Significant tenderness or worsening of abdominal pains  Swelling of the abdomen that is new, acute  Fever of 100F or higher  For urgent or emergent issues, a gastroenterologist can be reached at any hour by calling [phone]. Do not use MyChart messaging for urgent concerns.    DIET:  We do recommend a small meal at first, but then you may proceed to your regular diet.  Drink plenty of fluids but you should avoid alcoholic  beverages for 24 hours.  ACTIVITY:  You should plan to take it easy for the rest of today and you should NOT DRIVE or use heavy machinery until tomorrow (because of the sedation medicines used during the test).    FOLLOW UP: Our staff will call the number listed on your records the next business day following your procedure.  We will call around 7:15- 8:00 am to check on you and address any questions or concerns that you may have regarding the information given to you following your procedure. If we do not reach you, we will leave a message.     If any biopsies were taken you will be contacted by phone or by letter within the next 1-3 weeks.  Please call us  at [phone] if you have not heard about the biopsies in 3 weeks.    SIGNATURES/CONFIDENTIALITY: You and/or your care partner have signed paperwork which will be entered into your electronic medical record.  These signatures attest to the fact that that the information above on your After Visit Summary has been reviewed and is understood.  Full responsibility of the confidentiality of this discharge information lies with you and/or your care-partner.

## 2023-10-09 ENCOUNTER — Telehealth: Payer: Self-pay | Admitting: *Deleted

## 2023-10-09 NOTE — Telephone Encounter (Signed)
 Post procedure follow up call placed, no answer and left VM.

## 2023-10-13 LAB — SURGICAL PATHOLOGY

## 2023-10-14 DIAGNOSIS — Z419 Encounter for procedure for purposes other than remedying health state, unspecified: Secondary | ICD-10-CM | POA: Diagnosis not present

## 2023-10-16 ENCOUNTER — Ambulatory Visit: Payer: Self-pay | Admitting: Gastroenterology

## 2023-10-16 NOTE — Progress Notes (Signed)
Brandi Sanders,  Good news: the polyp (or polyps) that I removed during your recent examination were NOT precancerous.  You should continue to follow current colorectal cancer screening guidelines with a repeat colonoscopy in 10 years.    If you develop any new rectal bleeding, abdominal pain or significant bowel habit changes, please contact me before then.

## 2023-11-14 DIAGNOSIS — Z419 Encounter for procedure for purposes other than remedying health state, unspecified: Secondary | ICD-10-CM | POA: Diagnosis not present

## 2023-12-15 DIAGNOSIS — Z419 Encounter for procedure for purposes other than remedying health state, unspecified: Secondary | ICD-10-CM | POA: Diagnosis not present

## 2024-02-14 DIAGNOSIS — Z419 Encounter for procedure for purposes other than remedying health state, unspecified: Secondary | ICD-10-CM | POA: Diagnosis not present

## 2024-04-03 ENCOUNTER — Ambulatory Visit: Payer: Self-pay | Admitting: Family Medicine

## 2024-04-03 VITALS — BP 131/88 | HR 98 | Ht 64.0 in | Wt 180.0 lb

## 2024-04-03 DIAGNOSIS — Z3A01 Less than 8 weeks gestation of pregnancy: Secondary | ICD-10-CM | POA: Diagnosis present

## 2024-04-03 DIAGNOSIS — Z3201 Encounter for pregnancy test, result positive: Secondary | ICD-10-CM | POA: Diagnosis present

## 2024-04-03 DIAGNOSIS — I10 Essential (primary) hypertension: Secondary | ICD-10-CM | POA: Diagnosis not present

## 2024-04-03 LAB — POCT URINE PREGNANCY: Preg Test, Ur: POSITIVE — AB

## 2024-04-03 MED ORDER — NIFEDIPINE ER OSMOTIC RELEASE 30 MG PO TB24: 30.0000 mg | ORAL_TABLET | Freq: Every day | ORAL | 3 refills | Status: AC

## 2024-04-03 NOTE — Assessment & Plan Note (Signed)
-   discontinued amlodipine  - begin nifedipine  30 mg

## 2024-04-03 NOTE — Progress Notes (Signed)
" ° ° °  SUBJECTIVE:   CHIEF COMPLAINT / HPI:   Positive home pregnancy test- previously [id number] Desires pregnancy? yes FDLMP? 11/12 Regular? yes Symptoms? Nausea, no cramping bleeding or spotting.  If positive in office today, patient will be about [redacted]w[redacted]d  No issues with previous pregnancies, prior deliveries vaginal  Gestational diabetes with most recent pregnancy.  PERTINENT  PMH / PSH: HTN  OBJECTIVE:   BP 131/88   Pulse 98   Ht 5' 4 (1.626 m)   Wt 180 lb (81.6 kg)   LMP 02/14/2024   SpO2 97%   BMI 30.90 kg/m   General: A&O, NAD Cardiac: RRR, no m/r/g Respiratory: CTAB, normal WOB, no w/c/r GI: Soft, NTTP, non-distended  Extremities: NTTP, no peripheral edema.  ASSESSMENT/PLAN:   Assessment & Plan Less than [redacted] weeks gestation of pregnancy - positive pregnancy test in office today - certain of LMP, no need for dating US  - initial OB labs collected today - amb ref to high risk OB d/t HTN Essential hypertension, benign - discontinued amlodipine  - begin nifedipine  30 mg   Lucie Pinal, DO [facility] "

## 2024-04-03 NOTE — Patient Instructions (Addendum)
 It was wonderful to see you today!  Congratulations on your pregnancy! You will need to stop at the front desk or call to schedule your first prenatal appointment. Below is a list of over the counter medicines that are safe to use while pregnant.  Safe Medications in Pregnancy   Acne:  Benzoyl Peroxide  Salicylic Acid   Backache/Headache:  Tylenol : 2 regular strength every 4 hours OR               2 extra strength every 6 hours  ** Do not exceed 4000 mg of tylenol  in 24 hours   Colds/Coughs/Allergies:  Benadryl  (alcohol free) 25 mg every 6 hours as needed  Breath right strips  Claritin   Cepacol throat lozenges  Chloraseptic throat spray  Cold-Eeze- up to three times per day  Cough drops, alcohol free  Flonase  (by prescription only)  Guaifenesin   Mucinex   Robitussin DM (plain only, alcohol free)  Saline nasal spray/drops  Sudafed (pseudoephedrine) & Actifed * use only after [redacted] weeks gestation and if you do not have high blood pressure  Tylenol   Vicks Vaporub  Zinc lozenges  Zyrtec   Constipation:  Colace  Ducolax suppositories  Fleet enema  Glycerin  suppositories  Metamucil  Milk of magnesia  Miralax   Senokot  Smooth move tea   Diarrhea:  Kaopectate  Imodium A-D  **NO pepto Bismol   Hemorrhoids:  Anusol  Anusol HC  Preparation H  Tucks   Indigestion:  Tums  Maalox  Mylanta  Zantac  Pepcid   Insomnia:  Benadryl  (alcohol free) 25mg  every 6 hours as needed  Tylenol  PM  Unisom, no Gelcaps   Leg Cramps:  Tums  MagGel   Nausea/Vomiting:  Bonine  Dramamine  Emetrol  Ginger extract  Sea bands  Meclizine  Nausea medication to take during pregnancy:  Unisom (doxylamine succinate 25 mg tablets) Take one tablet daily at bedtime. If symptoms are not adequately controlled, the dose can be increased to a maximum recommended dose of two tablets daily (1/2 tablet in the morning, 1/2 tablet mid-afternoon and one at bedtime).  Vitamin B6 100mg  tablets.  Take one tablet twice a day (up to 200 mg per day).   Skin Rashes:  Aveeno products  Benadryl  cream or 25mg  every 6 hours as needed  Calamine Lotion  1% cortisone cream   Yeast infection:  Gyne-lotrimin 7  Monistat 7   **If taking multiple medications, please check labels to avoid duplicating the same active ingredients  **Take medications as directed on the label  **Do not take medications that contain ibuprofen  or aspirin  without discussing with your physician   Because you are pregnant, we will change the medication you are taking for high blood pressure to nifedipine . Please stop taking the amlodipine .  Please call [phone] with any questions about today's appointment.   If you need any additional refills, please call your pharmacy before calling the office.  Lucie Pinal, DO Family Medicine

## 2024-04-05 LAB — PREGNANCY, INITIAL SCREEN
Antibody Screen: NEGATIVE
Basophils Absolute: 0 x10E3/uL (ref 0.0–0.2)
Basos: 1 %
Bilirubin, UA: NEGATIVE
Chlamydia trachomatis, NAA: NEGATIVE
EOS (ABSOLUTE): 0.3 x10E3/uL (ref 0.0–0.4)
Eos: 5 %
Glucose, UA: NEGATIVE
HCV Ab: NONREACTIVE
HIV Screen 4th Generation wRfx: NONREACTIVE
Hematocrit: 40.2 % (ref 34.0–46.6)
Hemoglobin: 13.4 g/dL (ref 11.1–15.9)
Hepatitis B Surface Ag: NEGATIVE
Immature Grans (Abs): 0 x10E3/uL (ref 0.0–0.1)
Immature Granulocytes: 0 %
Ketones, UA: NEGATIVE
Leukocytes,UA: NEGATIVE
Lymphocytes Absolute: 2 x10E3/uL (ref 0.7–3.1)
Lymphs: 32 %
MCH: 30.9 pg (ref 26.6–33.0)
MCHC: 33.3 g/dL (ref 31.5–35.7)
MCV: 93 fL (ref 79–97)
Monocytes Absolute: 0.4 x10E3/uL (ref 0.1–0.9)
Monocytes: 6 %
Neisseria Gonorrhoeae by PCR: NEGATIVE
Neutrophils Absolute: 3.6 x10E3/uL (ref 1.4–7.0)
Neutrophils: 56 %
Nitrite, UA: NEGATIVE
Platelets: 260 x10E3/uL (ref 150–450)
RBC, UA: NEGATIVE
RBC: 4.34 x10E6/uL (ref 3.77–5.28)
RDW: 13.5 % (ref 11.7–15.4)
RPR Ser Ql: NONREACTIVE
Rh Factor: POSITIVE
Rubella Antibodies, IGG: 1.58 {index}
Specific Gravity, UA: 1.021 (ref 1.005–1.030)
Urobilinogen, Ur: 0.2 mg/dL (ref 0.2–1.0)
WBC: 6.3 x10E3/uL (ref 3.4–10.8)
pH, UA: 6 (ref 5.0–7.5)

## 2024-04-05 LAB — MICROSCOPIC EXAMINATION
Bacteria, UA: NONE SEEN
Casts: NONE SEEN /LPF

## 2024-04-05 LAB — HCV INTERPRETATION

## 2024-04-05 LAB — URINE CULTURE, OB REFLEX

## 2024-04-07 ENCOUNTER — Ambulatory Visit: Payer: Self-pay | Admitting: Family Medicine

## 2024-05-01 ENCOUNTER — Encounter: Payer: Self-pay | Admitting: *Deleted

## 2024-05-02 ENCOUNTER — Encounter

## 2024-05-14 ENCOUNTER — Encounter
